# Patient Record
Sex: Female | Born: 1955
Health system: Southern US, Community
[De-identification: ages and names within clinical notes are randomized; demographics above are authoritative.]

## PROBLEM LIST (undated history)

## (undated) DIAGNOSIS — F3289 Other specified depressive episodes: Secondary | ICD-10-CM

## (undated) DIAGNOSIS — E538 Deficiency of other specified B group vitamins: Secondary | ICD-10-CM

## (undated) DIAGNOSIS — F329 Major depressive disorder, single episode, unspecified: Secondary | ICD-10-CM

## (undated) DIAGNOSIS — K219 Gastro-esophageal reflux disease without esophagitis: Secondary | ICD-10-CM

## (undated) DIAGNOSIS — K589 Irritable bowel syndrome without diarrhea: Secondary | ICD-10-CM

## (undated) DIAGNOSIS — E785 Hyperlipidemia, unspecified: Secondary | ICD-10-CM

## (undated) DIAGNOSIS — J309 Allergic rhinitis, unspecified: Secondary | ICD-10-CM

## (undated) HISTORY — DX: Deficiency of other specified B group vitamins: E53.8

## (undated) HISTORY — DX: Hyperlipidemia, unspecified: E78.5

## (undated) HISTORY — DX: Gastro-esophageal reflux disease without esophagitis: K21.9

## (undated) HISTORY — DX: Other specified depressive episodes: F32.89

## (undated) HISTORY — DX: Irritable bowel syndrome, unspecified: K58.9

## (undated) HISTORY — DX: Major depressive disorder, single episode, unspecified: F32.9

## (undated) HISTORY — DX: Allergic rhinitis, unspecified: J30.9

---

## 1999-08-04 ENCOUNTER — Other Ambulatory Visit: Admission: RE | Admit: 1999-08-04 | Discharge: 1999-08-04 | Payer: Self-pay | Admitting: *Deleted

## 1999-09-09 ENCOUNTER — Encounter: Payer: Self-pay | Admitting: *Deleted

## 1999-09-09 ENCOUNTER — Encounter: Admission: RE | Admit: 1999-09-09 | Discharge: 1999-09-09 | Payer: Self-pay | Admitting: *Deleted

## 2005-11-22 HISTORY — PX: METATARSAL OSTEOTOMY: SHX1641

## 2009-01-13 LAB — CONVERTED CEMR LAB: Pap Smear: NORMAL

## 2009-02-10 ENCOUNTER — Encounter: Payer: Self-pay | Admitting: Internal Medicine

## 2009-03-21 ENCOUNTER — Ambulatory Visit: Payer: Self-pay | Admitting: Internal Medicine

## 2009-03-21 DIAGNOSIS — Z87891 Personal history of nicotine dependence: Secondary | ICD-10-CM | POA: Insufficient documentation

## 2009-03-21 DIAGNOSIS — J309 Allergic rhinitis, unspecified: Secondary | ICD-10-CM | POA: Insufficient documentation

## 2009-09-10 ENCOUNTER — Ambulatory Visit: Payer: Self-pay | Admitting: Internal Medicine

## 2009-09-10 DIAGNOSIS — R5381 Other malaise: Secondary | ICD-10-CM | POA: Insufficient documentation

## 2009-09-10 DIAGNOSIS — R5383 Other fatigue: Secondary | ICD-10-CM

## 2009-09-11 DIAGNOSIS — E538 Deficiency of other specified B group vitamins: Secondary | ICD-10-CM | POA: Insufficient documentation

## 2009-09-11 DIAGNOSIS — F419 Anxiety disorder, unspecified: Secondary | ICD-10-CM

## 2009-09-11 DIAGNOSIS — F32A Depression, unspecified: Secondary | ICD-10-CM | POA: Insufficient documentation

## 2009-09-11 DIAGNOSIS — F329 Major depressive disorder, single episode, unspecified: Secondary | ICD-10-CM

## 2009-09-12 ENCOUNTER — Encounter: Payer: Self-pay | Admitting: Internal Medicine

## 2009-09-12 LAB — CONVERTED CEMR LAB
ALT: 21 units/L (ref 0–35)
AST: 22 units/L (ref 0–37)
Albumin: 4.2 g/dL (ref 3.5–5.2)
Alkaline Phosphatase: 50 units/L (ref 39–117)
BUN: 11 mg/dL (ref 6–23)
Basophils Absolute: 0 10*3/uL (ref 0.0–0.1)
Basophils Relative: 0.9 % (ref 0.0–3.0)
Bilirubin Urine: NEGATIVE
Bilirubin, Direct: 0.1 mg/dL (ref 0.0–0.3)
CO2: 28 meq/L (ref 19–32)
Calcium: 8.9 mg/dL (ref 8.4–10.5)
Chloride: 105 meq/L (ref 96–112)
Creatinine, Ser: 0.7 mg/dL (ref 0.4–1.2)
Eosinophils Absolute: 0.2 10*3/uL (ref 0.0–0.7)
Eosinophils Relative: 4.6 % (ref 0.0–5.0)
Folate: 8.3 ng/mL
GFR calc non Af Amer: 93.29 mL/min (ref >60)
Glucose, Bld: 91 mg/dL (ref 70–99)
HCT: 39.5 % (ref 36.0–46.0)
Hemoglobin: 13.8 g/dL (ref 12.0–15.0)
Ketones, ur: NEGATIVE mg/dL
Leukocytes, UA: NEGATIVE
Lymphocytes Relative: 24.8 % (ref 12.0–46.0)
Lymphs Abs: 1.3 10*3/uL (ref 0.7–4.0)
MCHC: 34.9 g/dL (ref 30.0–36.0)
MCV: 97.5 fL (ref 78.0–100.0)
Monocytes Absolute: 0.4 10*3/uL (ref 0.1–1.0)
Monocytes Relative: 7 % (ref 3.0–12.0)
Neutro Abs: 3.4 10*3/uL (ref 1.4–7.7)
Neutrophils Relative %: 62.7 % (ref 43.0–77.0)
Nitrite: NEGATIVE
Platelets: 316 10*3/uL (ref 150.0–400.0)
Potassium: 4.6 meq/L (ref 3.5–5.1)
RBC: 4.05 M/uL (ref 3.87–5.11)
RDW: 11.6 % (ref 11.5–14.6)
Sodium: 141 meq/L (ref 135–145)
Specific Gravity, Urine: 1.015 (ref 1.000–1.030)
TSH: 0.99 microintl units/mL (ref 0.35–5.50)
Total Bilirubin: 0.6 mg/dL (ref 0.3–1.2)
Total Protein, Urine: NEGATIVE mg/dL
Total Protein: 6.9 g/dL (ref 6.0–8.3)
Urine Glucose: NEGATIVE mg/dL
Urobilinogen, UA: 0.2 (ref 0.0–1.0)
Vitamin B-12: 194 pg/mL — ABNORMAL LOW (ref 211–911)
WBC: 5.3 10*3/uL (ref 4.5–10.5)
pH: 5.5 (ref 5.0–8.0)

## 2009-12-16 ENCOUNTER — Encounter (INDEPENDENT_AMBULATORY_CARE_PROVIDER_SITE_OTHER): Payer: Self-pay | Admitting: *Deleted

## 2010-01-02 ENCOUNTER — Ambulatory Visit: Payer: Self-pay | Admitting: Internal Medicine

## 2010-01-02 DIAGNOSIS — H6120 Impacted cerumen, unspecified ear: Secondary | ICD-10-CM | POA: Insufficient documentation

## 2010-01-19 ENCOUNTER — Encounter (INDEPENDENT_AMBULATORY_CARE_PROVIDER_SITE_OTHER): Payer: Self-pay

## 2010-01-20 ENCOUNTER — Ambulatory Visit: Payer: Self-pay | Admitting: Internal Medicine

## 2010-02-13 ENCOUNTER — Ambulatory Visit: Payer: Self-pay | Admitting: Internal Medicine

## 2010-06-10 ENCOUNTER — Ambulatory Visit: Payer: Self-pay | Admitting: Internal Medicine

## 2010-06-10 DIAGNOSIS — K219 Gastro-esophageal reflux disease without esophagitis: Secondary | ICD-10-CM | POA: Insufficient documentation

## 2010-06-12 ENCOUNTER — Ambulatory Visit: Payer: Self-pay | Admitting: Internal Medicine

## 2010-06-12 ENCOUNTER — Telehealth: Payer: Self-pay | Admitting: Internal Medicine

## 2010-11-30 ENCOUNTER — Encounter: Payer: Self-pay | Admitting: Internal Medicine

## 2010-11-30 ENCOUNTER — Ambulatory Visit
Admission: RE | Admit: 2010-11-30 | Discharge: 2010-11-30 | Payer: Self-pay | Source: Home / Self Care | Attending: Internal Medicine | Admitting: Internal Medicine

## 2010-11-30 DIAGNOSIS — R1084 Generalized abdominal pain: Secondary | ICD-10-CM | POA: Insufficient documentation

## 2010-12-02 LAB — CONVERTED CEMR LAB: Tissue Transglutaminase Ab, IgA: 8.4 U

## 2010-12-24 NOTE — Assessment & Plan Note (Signed)
Summary: ?acid reflux/#/cd   Vital Signs:  Patient profile:   55 year old female Menstrual status:  regular Height:      67 inches (170.18 cm) Weight:      167.0 pounds (75.91 kg) O2 Sat:      98 % on Room air Temp:     97.7 degrees F (36.50 degrees C) oral Pulse rate:   75 / minute BP sitting:   120 / 72  (left arm) Cuff size:   regular  Vitals Entered By: Orlan Leavens (June 10, 2010 11:31 AM)  O2 Flow:  Room air CC: ? acid reflux Is Patient Diabetic? No Pain Assessment Patient in pain? no        Primary Care Provider:  Newt Lukes MD  CC:  ? acid reflux.  History of Present Illness: c/o ?reflux - onset 3 months ago -  symptoms occur daily - manifest by "gunk" in back of throat upon waking each morning - occ burning in chest and "upset" churning in stomach - symptoms unchanged by food intake or type of food  Current Medications (verified): 1)  Nascobal 500 Mcg/0.75ml Soln (Cyanocobalamin) .... Use 1 Spray in One Nostril Once A Week 2)  Celexa 10 Mg Tabs (Citalopram Hydrobromide) .... On Hold  Allergies (verified): 1)  ! * Pollen 2)  ! * Mold 3)  ! * Cat Dander  Past History:  Past Medical History: Allergic rhinitis B12 defic GERD  Review of Systems  The patient denies fever, headaches, abdominal pain, and severe indigestion/heartburn.    Physical Exam  General:  alert, well-developed, well-nourished, and cooperative to examination.    Lungs:  normal respiratory effort, no intercostal retractions or use of accessory muscles; normal breath sounds bilaterally - no crackles and no wheezes.    Heart:  normal rate, regular rhythm, no murmur, and no rub. BLE without edema. Abdomen:  soft, non-tender, normal bowel sounds, no distention; no masses and no appreciable hepatomegaly or splenomegaly.   Psych:  Oriented X3, memory intact for recent and remote, normally interactive, good eye contact, not anxious appearing, not depressed appearing, and not  agitated.      Impression & Recommendations:  Problem # 1:  GERD (ICD-530.81)  Her updated medication list for this problem includes:    Omeprazole 20 Mg Cpdr (Omeprazole) .Marland Kitchen... 1 by mouth two times a day (or as instructed)  Labs Reviewed: Hgb: 13.8 (09/10/2009)   Hct: 39.5 (09/10/2009)  Problem # 2:  ALLERGIC RHINITIS (ICD-477.9) tx same may also help the AM awakening symptoms of "gunk" in throat (along with PPI tx) Her updated medication list for this problem includes:    Loratadine 10 Mg Tabs (Loratadine) .Marland Kitchen... 1 by mouth at bedtime  Discussed use of allergy medications and environmental measures.   Problem # 3:  VITAMIN B12 DEFICIENCY (ICD-266.2) pt wishes to change nasal spray to montly IM (for improved compliance) - will return Fri for nurse visit to teach family member on administration as pt hopes to do this at home rather than monthly OV here  Complete Medication List: 1)  Cyanocobalamin 1000 Mcg/ml Soln (Cyanocobalamin) .... Im monthly 2)  Omeprazole 20 Mg Cpdr (Omeprazole) .Marland Kitchen.. 1 by mouth two times a day (or as instructed) 3)  Loratadine 10 Mg Tabs (Loratadine) .Marland Kitchen.. 1 by mouth at bedtime  Patient Instructions: 1)  it was good to see you today. 2)  start allergy and reflux med tx as discussed -  your prescriptions have been given to  you yo submit to your pharmacy. Please take as directed. Contact our office if you believe you're having problems with the medication(s).  3)  if continued symptoms in 4 weeks, call for re-evaluation of treatment 4)  change B12 nose spray to monthly shots - return on Friday for nurse visit to do this - if your daughter is comfortable doing same for you each month at home, will give you prescription for medicine and supplies at that time.  Prescriptions: LORATADINE 10 MG TABS (LORATADINE) 1 by mouth at bedtime  #30 x 3   Entered and Authorized by:   Newt Lukes MD   Signed by:   Newt Lukes MD on 06/10/2010   Method used:    Print then Give to Patient   RxID:   [id number] OMEPRAZOLE 20 MG CPDR (OMEPRAZOLE) 1 by mouth two times a day (or as instructed)  #60 x 1   Entered and Authorized by:   Newt Lukes MD   Signed by:   Newt Lukes MD on 06/10/2010   Method used:   Print then Give to Patient   RxID:   [id number]

## 2010-12-24 NOTE — Letter (Signed)
Summary: [facility] Instructions  [facility]  [address] [place], [place] [address]   Phone: [phone]  Fax: [phone]       AMELA HANDLEY    February 19, 1968    MRN: [id number]       Procedure Day /Date: Friday 02/13/10     Arrival Time: 7:30 am     Procedure Time: 8:30 am    Location of Procedure:                    _x_  [facility] (4th Floor)        PREPARATION FOR COLONOSCOPY WITH MIRALAX  Starting 5 days prior to your procedure  02/08/10 do not eat nuts, seeds, popcorn, corn, beans, peas,  salads, or any raw vegetables.  Do not take any fiber supplements (e.g. Metamucil, Citrucel, and Benefiber). ____________________________________________________________________________________________________     THE DAY BEFORE YOUR PROCEDURE         DATE:02/12/10  ONG:EXBMWUXL  1   Drink clear liquids the entire day-NO SOLID FOOD  2   Do not drink anything colored red or purple.  Avoid juices with pulp.  No orange juice.  3   Drink at least 64 oz. (8 glasses) of fluid/clear liquids during the day to prevent dehydration and help the prep work efficiently.  CLEAR LIQUIDS INCLUDE: Water Jello Ice Popsicles Tea (sugar ok, no milk/cream) Powdered fruit flavored drinks Coffee (sugar ok, no milk/cream) Gatorade Juice: apple, white grape, white cranberry  Lemonade Clear bullion, consomm, broth Carbonated beverages (any kind) Strained chicken noodle soup Hard Candy  4   Mix the entire bottle of Miralax with 64 oz. of Gatorade/Powerade in the morning and put in the refrigerator to chill.  5   At 3:00 pm take 2 Dulcolax/Bisacodyl tablets.  6   At 4:30 pm take one Reglan/Metoclopramide tablet.  7  Starting at 5:00 pm drink one 8 oz glass of the Miralax mixture every 15-20 minutes until you have finished drinking the entire 64 oz.  You should finish drinking prep around 7:30 or 8:00 pm.  8   If you are nauseated, you may take the 2nd  Reglan/Metoclopramide tablet at 6:30 pm.        9    At 8:00 pm take 2 more DULCOLAX/Bisacodyl tablets.     THE DAY OF YOUR PROCEDURE      DATE:  02/13/10 DAY: Friday  You may drink clear liquids until  6:30 am (2 HOURS BEFORE PROCEDURE).   MEDICATION INSTRUCTIONS  Unless otherwise instructed, you should take regular prescription medications with a small sip of water as early as possible the morning of your procedure.          OTHER INSTRUCTIONS  You will need a responsible adult at least 54 years of age to accompany you and drive you home.   This person must remain in the waiting room during your procedure.  Wear loose fitting clothing that is easily removed.  Leave jewelry and other valuables at home.  However, you may wish to bring a book to read or an iPod/MP3 player to listen to music as you wait for your procedure to start.  Remove all body piercing jewelry and leave at home.  Total time from sign-in until discharge is approximately 2-3 hours.  You should go home directly after your procedure and rest.  You can resume normal activities the day after your procedure.  The day of your procedure you should not:  Drive   Make legal decisions   Operate machinery   Drink alcohol   Return to work  You will receive specific instructions about eating, activities and medications before you leave.   The above instructions have been reviewed and explained to me by   Ulis Rias RN  January 20, 2010 9:04 AM     I fully understand and can verbalize these instructions _____________________________ Date _______

## 2010-12-24 NOTE — Letter (Signed)
Summary: Previsit letter  [facility]  [address] [place], [place] [address]   Phone: [phone]  Fax: [phone]       12/16/2009 MRN: [id number]  [facility] [address] [place], [place]  [address]  Dear Ms. Dohrmann,  Welcome to the Gastroenterology Division at [facility].    You are scheduled to see a nurse for your pre-procedure visit on 01/20/2010 at 9:00AM on the 3rd/ floor at [facility], [address]  We ask that you try to arrive at our office 15 minutes prior to your appointment time to allow for check-in.  Your nurse visit will consist of discussing your medical and surgical history, your immediate family medical history, and your medications.    Please bring a complete list of all your medications or, if you prefer, bring the medication bottles and we will list them.  We will need to be aware of both prescribed and over the counter drugs.  We will need to know exact dosage information as well.  If you are on blood thinners (Coumadin, Plavix, Aggrenox, Ticlid, etc.) please call our office today/prior to your appointment, as we need to consult with your physician about holding your medication.   Please be prepared to read and sign documents such as consent forms, a financial agreement, and acknowledgement forms.  If necessary, and with your consent, a friend or relative is welcome to sit-in on the nurse visit with you.  Please bring your insurance card so that we may make a copy of it.  If your insurance requires a referral to see a specialist, please bring your referral form from your primary care physician.  No co-pay is required for this nurse visit.     If you cannot keep your appointment, please call [phone] to cancel or reschedule prior to your appointment date.  This allows [place] the opportunity to schedule an appointment for another patient in need of care.    Thank you for choosing [facility] for your medical needs.   We appreciate the opportunity to care for you.  Please visit [place] at our website  to learn more about our practice.                     Sincerely.                                                                                                                   The Gastroenterology Division

## 2010-12-24 NOTE — Miscellaneous (Signed)
Summary: Lec previsit  Clinical Lists Changes  Medications: Added new medication of MIRALAX   POWD (POLYETHYLENE GLYCOL 3350) As per prep  instructions. - Signed Added new medication of REGLAN 10 MG  TABS (METOCLOPRAMIDE HCL) As per prep instructions. - Signed Added new medication of DULCOLAX 5 MG  TBEC (BISACODYL) Day before procedure take 2 at 3pm and 2 at 8pm. - Signed Rx of MIRALAX   POWD (POLYETHYLENE GLYCOL 3350) As per prep  instructions.;  #255gm x 0;  Signed;  Entered by: Ulis Rias RN;  Authorized by: Hart Carwin MD;  Method used: Electronically to [facility] [address] [place], [place], [place]  [address], Ph: [phone], Fax: [phone] Rx of REGLAN 10 MG  TABS (METOCLOPRAMIDE HCL) As per prep instructions.;  #2 x 0;  Signed;  Entered by: Ulis Rias RN;  Authorized by: Hart Carwin MD;  Method used: Electronically to [facility] [address], [place], [place], [place]  [address], Ph: [phone], Fax: [phone] Rx of DULCOLAX 5 MG  TBEC (BISACODYL) Day before procedure take 2 at 3pm and 2 at 8pm.;  #4 x 0;  Signed;  Entered by: Ulis Rias RN;  Authorized by: Hart Carwin MD;  Method used: Electronically to [facility] [address], [place], [place], [place]  [address], Ph: [phone], Fax: [phone] Observations: Added new observation of ALLERGY REV: Done (01/20/2010 8:42)    Prescriptions: DULCOLAX 5 MG  TBEC (BISACODYL) Day before procedure take 2 at 3pm and 2 at 8pm.  #4 x 0   Entered by:   Ulis Rias RN   Authorized by:   Hart Carwin MD   Signed by:   Ulis Rias RN on 01/20/2010   Method used:   Electronically to        [facility] (retail)       [address]       [place], [place]  [address]       Ph: [phone]       Fax: [phone]   RxID:   [id number] REGLAN 10 MG  TABS (METOCLOPRAMIDE HCL) As per prep instructions.   #2 x 0   Entered by:   Ulis Rias RN   Authorized by:   Hart Carwin MD   Signed by:   Ulis Rias RN on 01/20/2010   Method used:   Electronically to        [facility] (retail)       [address]       [place], [place]  [address]       Ph: [phone]       Fax: [phone]   RxID:   [id number] MIRALAX   POWD (POLYETHYLENE GLYCOL 3350) As per prep  instructions.  #255gm x 0   Entered by:   Ulis Rias RN   Authorized by:   Hart Carwin MD   Signed by:   Ulis Rias RN on 01/20/2010   Method used:   Electronically to        [facility] (retail)       [address]       [place], [place]  [address]       Ph: [phone]       Fax: [phone]   RxID:   [id number]

## 2010-12-24 NOTE — Assessment & Plan Note (Signed)
Summary: f/u appt/cd   Vital Signs:  Patient profile:   55 year old female Menstrual status:  regular Height:      67 inches (170.18 cm) Weight:      165.8 pounds (75.36 kg) BMI:     26.06 O2 Sat:      98 % on Room air Temp:     98.1 degrees F (36.72 degrees C) oral Pulse rate:   82 / minute BP sitting:   112 / 80  (left arm) Cuff size:   regular  Vitals Entered By: Orlan Leavens RMA (November 30, 2010 8:36 AM)  O2 Flow:  Room air CC: follow-up visit Is Patient Diabetic? No Pain Assessment Patient in pain? no      Comments Want to discuss B12 injections   Primary Care Provider:  Newt Lukes MD  CC:  follow-up visit.  History of Present Illness: GERD onset spring 2011 symptoms occur daily - manifest by "gunk" in back of throat upon waking each morning - occ burning in chest and "upset" churning in stomach - symptoms unchanged by food intake or type of food a/w abd bloating and discomfort no change bowels or weight not improved with 2x/d omeprazole  B12 defic - dx 08/2009 when c/o fatigue on monthly B12 shots at home -  inconsistent admin due to difficulty obtaining supplies   Clinical Review Panels:  Prevention   Last Mammogram:  normal (11/22/2006)   Last Pap Smear:  normal (01/13/2009)   Last Colonoscopy:  DONE (02/13/2010)  CBC   WBC:  5.3 (09/10/2009)   RBC:  4.05 (09/10/2009)   Hgb:  13.8 (09/10/2009)   Hct:  39.5 (09/10/2009)   Platelets:  316.0 (09/10/2009)   MCV  97.5 (09/10/2009)   MCHC  34.9 (09/10/2009)   RDW  11.6 (09/10/2009)   PMN:  62.7 (09/10/2009)   Lymphs:  24.8 (09/10/2009)   Monos:  7.0 (09/10/2009)   Eosinophils:  4.6 (09/10/2009)   Basophil:  0.9 (09/10/2009)  Complete Metabolic Panel   Glucose:  91 (09/10/2009)   Sodium:  141 (09/10/2009)   Potassium:  4.6 (09/10/2009)   Chloride:  105 (09/10/2009)   CO2:  28 (09/10/2009)   BUN:  11 (09/10/2009)   Creatinine:  0.7 (09/10/2009)   Albumin:  4.2 (09/10/2009)   Total  Protein:  6.9 (09/10/2009)   Calcium:  8.9 (09/10/2009)   Total Bili:  0.6 (09/10/2009)   Alk Phos:  50 (09/10/2009)   SGPT (ALT):  21 (09/10/2009)   SGOT (AST):  22 (09/10/2009)   Current Medications (verified): 1)  Omeprazole 20 Mg Cpdr (Omeprazole) .Marland Kitchen.. 1 By Mouth Two Times A Day (Or As Instructed) 2)  Loratadine 10 Mg Tabs (Loratadine) .Marland Kitchen.. 1 By Mouth At Bedtime 3)  Cyanocobalamin 1000 Mcg/ml Soln (Cyanocobalamin) .... Take 1 Injection Q Month Im 4)  Bd Syringe/needle 25g X 5/8" 3 Ml Misc (Syringe/needle (Disp)) .... Use 1 Q Month To Give B12 Injection  Allergies (verified): 1)  ! * Pollen 2)  ! * Mold 3)  ! * Cat Dander  Past History:  Past Medical History: Allergic rhinitis B12 defic - dx 08/2009 GERD  MD roster: GI - brodie  Past Surgical History: Caesarean section x's 2  Broken foot-fifth metatarsal (2007)  Family History: Reviewed history from 03/21/2009 and no changes required. Family History of Colon CA 1st degree relative <60 (brother) Family History Lung cancer (father) - died age 45 mom died age 78 - decline after hip surg  Social History: Reviewed history from 03/21/2009 and no changes required. 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    Physical Exam  General:  alert, well-developed, well-nourished, and cooperative to examination.    Lungs:  normal respiratory effort, no intercostal retractions or use of accessory muscles; normal breath sounds bilaterally - no crackles and no wheezes.    Heart:  normal rate, regular rhythm, no murmur, and no rub. BLE without edema. Abdomen:  soft, non-tender, normal bowel sounds, no distention; no masses and no appreciable hepatomegaly or splenomegaly.     Impression & Recommendations:  Problem # 1:   GERD (ICD-530.81)  change omeprazole to different PPI - erx for protonix done also check for celiac given abd bloat and discomfort with B12 defic  Her updated medication list for this problem includes:    Protonix 40 Mg Tbec (Pantoprazole sodium) .Marland Kitchen... 1 by mouth two times a day (or as directed)  Orders: T-Celiac Disease Ab Evaluation (8756) Prescription Created Electronically [phone])  Labs Reviewed: Hgb: 13.8 (09/10/2009)   Hct: 39.5 (09/10/2009)  Problem # 2:  ABDOMINAL PAIN, GENERALIZED (ICD-789.07) exam and hx benign - check for  and rec gluten free trial diet as well as change in PPI  Orders: T-Celiac Disease Ab Evaluation (8002)  Problem # 3:  VITAMIN B12 DEFICIENCY (ICD-266.2)  Orders: T-Celiac Disease Ab Evaluation (8002) Vit B12 1000 mcg (J3420) Admin of Therapeutic Inj  intramuscular or subcutaneous (51884)  inconsistent nasal spray and IM replacement  monthly OV here discussed - shot today  Complete Medication List: 1)  Protonix 40 Mg Tbec (Pantoprazole sodium) .Marland Kitchen.. 1 by mouth two times a day (or as directed) 2)  Loratadine 10 Mg Tabs (Loratadine) .Marland Kitchen.. 1 by mouth at bedtime 3)  Cyanocobalamin 1000 Mcg/ml Soln (Cyanocobalamin) .... Take 1 injection q month im 4)  Bd Syringe/needle 25g X 5/8" 3 Ml Misc (Syringe/needle (disp)) .... Use 1 q month to give b12 injection  Patient Instructions: 1)  it was good to see you today. 2)  change omeprazole to generic Protonic for reflux symptoms - your prescriptions have been electronically submitted to your pharmacy. Please take as directed. Contact our office if you believe you're having problems with the medication(s). 3)  nurse visit here for B12 shot each month, or call if refills on nasal spray are needed 4)  test(s) ordered today - your results will be called to you after review in 48-72 hours from the time of test completion 5)  in meanwhile, try gluten free diet to help with bloating and abdominal discomfort 6)  Please  schedule a follow-up appointment in 3-6 months to review reflux and GI symptoms, call sooner if problems.  Prescriptions: PROTONIX 40 MG TBEC (PANTOPRAZOLE SODIUM) 1 by mouth two times a day (or as directed)  #60 x 3   Entered and Authorized by:   Newt Lukes MD   Signed by:   Newt Lukes MD on 11/30/2010   Method used:   Electronically to        [facility] (retail)       [address]       [place], [place]  [address]       Ph: [phone]  Fax: [phone]   RxID:   [id number]    Medication Administration  Injection # 1:    Medication: Vit B12 1000 mcg    Diagnosis: VITAMIN B12 DEFICIENCY (ICD-266.2)    Route: IM    Site: L deltoid    Exp Date: 06/2012    Lot #: [id number]    Mfr: [employer] Regent    Patient tolerated injection without complications    Given by: Orlan Leavens RMA (November 30, 2010 9:29 AM)  Orders Added: 1)  T-Celiac Disease Ab Evaluation [8002] 2)  Est. Patient Level IV [30865] 3)  Prescription Created Electronically [G8553] 4)  Vit B12 1000 mcg [J3420] 5)  Admin of Therapeutic Inj  intramuscular or subcutaneous [78469]

## 2010-12-24 NOTE — Assessment & Plan Note (Signed)
Summary: left ear wax/cd   Vital Signs:  Patient profile:   55 year old female Menstrual status:  regular Height:      67 inches (170.18 cm) Weight:      167.8 pounds (76.27 kg) BMI:     26.38 O2 Sat:      99 % on Room air Temp:     97.2 degrees F (36.22 degrees C) oral Pulse rate:   82 / minute BP sitting:   130 / 82  (left arm) Cuff size:   regular  Vitals Entered By: Orlan Leavens (January 02, 2010 8:32 AM)  O2 Flow:  Room air CC: ? (L) ear clogged Is Patient Diabetic? No Pain Assessment Patient in pain? no        Primary Care Provider:  Newt Lukes MD  CC:  ? (L) ear clogged.  History of Present Illness: here with decrease in hearing left side - tried using Qtip and felt more clogged - feels wax buildup likely... has tried using OTC drops w/o chnage in hearing or clogged feeling remote hx same requiring irrigation - no pain in ear - no HA no drainage from ear  B12 defic - reports compliance with ongoing medical treatment and no changes in medication dose or frequency. denies adverse side effects related to current therapy.   depression - not taking celexa but did have it feeled - hoping B12 replacement will resolve symptoms - does feel better and is more active than in the fall no sadness, sleeping well no anorexia or weight changes-  Current Medications (verified): 1)  Nascobal 500 Mcg/0.15ml Soln (Cyanocobalamin) .... Use 1 Spray in One Nostril Once A Week 2)  Celexa 10 Mg Tabs (Citalopram Hydrobromide) .... Take 1 Po Qd  Allergies (verified): 1)  ! * Pollen 2)  ! * Mold 3)  ! * Cat Dander  Past History:  Past Medical History: Allergic rhinitis B12 defic  Review of Systems       The patient complains of decreased hearing.  The patient denies fever, vision loss, chest pain, syncope, and headaches.    Physical Exam  General:  alert, well-developed, well-nourished, and cooperative to examination.    Eyes:  vision grossly intact; pupils  equal, round and reactive to light.  conjunctiva and lids normal.    Ears:  normal pinnae bilaterally, without erythema,  swelling, or tenderness to palpation - L ear with obstructing soft yellow cerumen - after irrigation, TMs clear without effusion, or cerumen impaction. Hearing grossly normal bilaterally  Mouth:  teeth and gums in good repair; mucous membranes moist, without lesions or ulcers. oropharynx clear without exudate, no erythema.  Lungs:  normal respiratory effort, no intercostal retractions or use of accessory muscles; normal breath sounds bilaterally - no crackles and no wheezes.    Heart:  normal rate, regular rhythm, no murmur, and no rub. BLE without edema. Psych:  Oriented X3, memory intact for recent and remote, normally interactive, good eye contact, not anxious appearing, not depressed appearing, and not agitated.    Additional Exam:  Procedure: ear irrigation Reason: wax impaction Risk/Benefit were discussed with patient who agrees to proceed. ear was irrigated with warm water. Large amount of wax was removed. Instrumentation with metal ear loop was performed to accomplish the wax removal. Patient tolerated procedure well. Complications: none    Impression & Recommendations:  Problem # 1:  CERUMEN IMPACTION, LEFT (ICD-380.4)  Orders: Cerumen Impaction Removal ([address])  Problem # 2:  VITAMIN B12 DEFICIENCY (  ICD-266.2) cont nasal spray - fatigue symptoms improved  Problem # 3:  DEPRESSION (ICD-311) not taking SSRI - but clinically seems well - ok to hold off on med and follow symptoms - pt to contact us if need for re-eval of same Her updated medication list for this problem includes:    Celexa 10 Mg Tabs (Citalopram hydrobromide) ..... On hold  Complete Medication List: 1)  Nascobal 500 Mcg/0.96ml Soln (Cyanocobalamin) .... Use 1 spray in one nostril once a week 2)  Celexa 10 Mg Tabs (Citalopram hydrobromide) .... On hold  Patient Instructions: 1)  it was good  to see you today.  2)  left ear irrigated and cleared today - 3)  continue medications as reviewed - let us know if "depression symptoms" are starting back or changing 4)  Please keep follow-up appointment as previously discussed (appprox 6 months), sooner if problems.

## 2010-12-24 NOTE — Assessment & Plan Note (Signed)
Summary: B12/JSS  Nurse Visit   Vitals Entered By: Orlan Leavens (June 12, 2010 11:48 AM) CC: B12 injection   Allergies: 1)  ! * Pollen 2)  ! * Mold 3)  ! * Cat Dander  Medication Administration  Injection # 1:    Medication: Vit B12 1000 mcg    Diagnosis: VITAMIN B12 DEFICIENCY (ICD-266.2)    Route: IM    Site: R deltoid    Exp Date: 02/2012    Lot #: [id number]    Mfr: American Regent    Patient tolerated injection without complications    Given by: Orlan Leavens (June 12, 2010 11:49 AM)  Orders Added: 1)  Vit B12 1000 mcg [J3420] 2)  Admin of Therapeutic Inj  intramuscular or subcutaneous [[address]]

## 2010-12-24 NOTE — Progress Notes (Signed)
Summary: b12 admin and rx med+supplies  Phone Note Call from Patient   Caller: Patient Summary of Call: Pt came in for B12 injection, brought daughter to watch admin so that she can give B12 to her at home. pt states she feel comfortable with daughter giving injection and req rx for B12 to be sent to [facility] on [address] Initial call taken by: Orlan Leavens,  June 12, 2010 11:52 AM  Follow-up for Phone Call        agree - thanks! Follow-up by: Newt Lukes MD,  June 12, 2010 12:22 PM    New/Updated Medications: CYANOCOBALAMIN 1000 MCG/ML SOLN (CYANOCOBALAMIN) take 1 injection q month IM BD SYRINGE/NEEDLE 25G X 5/8" 3 ML MISC (SYRINGE/NEEDLE (DISP)) use 1 q month to give B12 injection Prescriptions: BD SYRINGE/NEEDLE 25G X 5/8" 3 ML MISC (SYRINGE/NEEDLE (DISP)) use 1 q month to give B12 injection  #12 x 0   Entered by:   Orlan Leavens   Authorized by:   Newt Lukes MD   Signed by:   Orlan Leavens on 06/12/2010   Method used:   Electronically to        [facility] (retail)       [address]       [place], [place]  [address]       Ph: [phone]       Fax: [phone]   RxID:   [id number] CYANOCOBALAMIN 1000 MCG/ML SOLN (CYANOCOBALAMIN) take 1 injection q month IM  #13ml x 6   Entered by:   Orlan Leavens   Authorized by:   Newt Lukes MD   Signed by:   Orlan Leavens on 06/12/2010   Method used:   Electronically to        [facility] (retail)       [address]       [place], [place]  [address]       Ph: [phone]       Fax: [phone]   RxID:   [id number]   Appended Document: b12 admin and rx med+supplies [facility] called to inform MD that B-12 inj are on long term back order. Per pharmacy they will inform pt of the same.

## 2010-12-24 NOTE — Procedures (Signed)
Summary: Colonoscopy  Patient: Teresa Monroe Note: All result statuses are Final unless otherwise noted.  Tests: (1) Colonoscopy (COL)   COL Colonoscopy           DONE     [facility]     [address]     [place], [place]  [address]           COLONOSCOPY PROCEDURE REPORT           PATIENT:  Teresa, Monroe  MR#:  [id number]     BIRTHDATE:  Nov 14, 1956, 53 yrs. old  GENDER:  female     ENDOSCOPIST:  Hedwig Morton. Juanda Chance, MD     REF. BY:  Rene Paci, M.D.     PROCEDURE DATE:  02/13/2010     PROCEDURE:  Colonoscopy 295[address]     ASA CLASS:  Class I     INDICATIONS:  Routine Risk Screening     MEDICATIONS:   Versed 10 mg, Fentanyl 100 mcg           DESCRIPTION OF PROCEDURE:   After the risks benefits and     alternatives of the procedure were thoroughly explained, informed     consent was obtained.  Digital rectal exam was performed and     revealed no rectal masses.   The LB [id number] endoscope     was introduced through the anus and advanced to the cecum, which     was identified by both the appendix and ileocecal valve, without     limitations.  The quality of the prep was good, using MiraLax.     The instrument was then slowly withdrawn as the colon was fully     examined.     <<PROCEDUREIMAGES>>           FINDINGS:  No polyps or cancers were seen (see image1, image2,     image3, image4, and image5).   Retroflexed views in the rectum     revealed no abnormalities.    The scope was then withdrawn from     the patient and the procedure completed.           COMPLICATIONS:  None     ENDOSCOPIC IMPRESSION:     1) No polyps or cancers     2) Normal colonoscopy     RECOMMENDATIONS:     1) high fiber diet     REPEAT EXAM:  In 10 year(s) for.           ______________________________     Hedwig Morton. Juanda Chance, MD           CC:           n.     eSIGNED:   Hedwig Morton. Tezra Mahr at 02/13/2010 09:22 AM           Cathlean Sauer, [id number]  Note: An exclamation mark  (!) indicates a result that was not dispersed into the flowsheet. Document Creation Date: 02/13/2010 9:23 AM _______________________________________________________________________  (1) Order result status: Final Collection or observation date-time: 02/13/2010 09:08 Requested date-time:  Receipt date-time:  Reported date-time:  Referring Physician:   Ordering Physician: Lina Sar [phone]) Specimen Source:  Source: Launa Grill Order Number: [id number] Lab site:   Appended Document: Colonoscopy    Clinical Lists Changes  Observations: Added new observation of COLONNXTDUE: 01/2020 (02/13/2010 11:21)

## 2011-01-04 ENCOUNTER — Other Ambulatory Visit: Payer: Commercial Managed Care - PPO

## 2011-01-04 ENCOUNTER — Encounter: Payer: Self-pay | Admitting: Internal Medicine

## 2011-01-04 ENCOUNTER — Ambulatory Visit (INDEPENDENT_AMBULATORY_CARE_PROVIDER_SITE_OTHER): Payer: Commercial Managed Care - PPO | Admitting: Internal Medicine

## 2011-01-04 ENCOUNTER — Other Ambulatory Visit: Payer: Self-pay | Admitting: Internal Medicine

## 2011-01-04 DIAGNOSIS — R1084 Generalized abdominal pain: Secondary | ICD-10-CM

## 2011-01-04 DIAGNOSIS — R5383 Other fatigue: Secondary | ICD-10-CM

## 2011-01-04 DIAGNOSIS — K219 Gastro-esophageal reflux disease without esophagitis: Secondary | ICD-10-CM

## 2011-01-04 DIAGNOSIS — F329 Major depressive disorder, single episode, unspecified: Secondary | ICD-10-CM

## 2011-01-04 DIAGNOSIS — R5381 Other malaise: Secondary | ICD-10-CM

## 2011-01-04 DIAGNOSIS — E538 Deficiency of other specified B group vitamins: Secondary | ICD-10-CM

## 2011-01-04 LAB — CBC WITH DIFFERENTIAL/PLATELET
Basophils Absolute: 0 10*3/uL (ref 0.0–0.1)
Basophils Relative: 0.5 % (ref 0.0–3.0)
Eosinophils Absolute: 0.1 10*3/uL (ref 0.0–0.7)
Eosinophils Relative: 1.8 % (ref 0.0–5.0)
HCT: 36.7 % (ref 36.0–46.0)
Hemoglobin: 12.6 g/dL (ref 12.0–15.0)
Lymphocytes Relative: 31 % (ref 12.0–46.0)
Lymphs Abs: 2 10*3/uL (ref 0.7–4.0)
MCHC: 34.4 g/dL (ref 30.0–36.0)
MCV: 95.4 fl (ref 78.0–100.0)
Monocytes Absolute: 0.6 10*3/uL (ref 0.1–1.0)
Monocytes Relative: 9.4 % (ref 3.0–12.0)
Neutro Abs: 3.7 10*3/uL (ref 1.4–7.7)
Neutrophils Relative %: 57.3 % (ref 43.0–77.0)
Platelets: 305 10*3/uL (ref 150.0–400.0)
RBC: 3.85 Mil/uL — ABNORMAL LOW (ref 3.87–5.11)
RDW: 12.8 % (ref 11.5–14.6)
WBC: 6.4 10*3/uL (ref 4.5–10.5)

## 2011-01-04 LAB — BASIC METABOLIC PANEL
BUN: 13 mg/dL (ref 6–23)
CO2: 27 mEq/L (ref 19–32)
Calcium: 8.7 mg/dL (ref 8.4–10.5)
Chloride: 100 mEq/L (ref 96–112)
Creatinine, Ser: 1 mg/dL (ref 0.4–1.2)
GFR: 60.11 mL/min (ref >60.00)
Glucose, Bld: 82 mg/dL (ref 70–99)
Potassium: 4.4 mEq/L (ref 3.5–5.1)
Sodium: 139 mEq/L (ref 135–145)

## 2011-01-04 LAB — HEPATIC FUNCTION PANEL
ALT: 18 U/L (ref 0–35)
AST: 24 U/L (ref 0–37)
Albumin: 3.7 g/dL (ref 3.5–5.2)
Alkaline Phosphatase: 49 U/L (ref 39–117)
Bilirubin, Direct: 0.1 mg/dL (ref 0.0–0.3)
Total Bilirubin: 0.6 mg/dL (ref 0.3–1.2)
Total Protein: 6.6 g/dL (ref 6.0–8.3)

## 2011-01-04 LAB — TSH: TSH: 0.81 u[IU]/mL (ref 0.35–5.50)

## 2011-01-13 NOTE — Assessment & Plan Note (Signed)
Summary: follow-up   Vital Signs:  Patient profile:   55 year old female Menstrual status:  regular Height:      67 inches (170.18 cm) Weight:      165 pounds (75 kg) O2 Sat:      97 % on Room air Temp:     98.6 degrees F (37.00 degrees C) oral Pulse rate:   74 / minute BP sitting:   128 / 82  (left arm) Cuff size:   regular  Vitals Entered By: Orlan Leavens RMA (January 04, 2011 2:10 PM)  O2 Flow:  Room air CC: follow-up visit Is Patient Diabetic? No Pain Assessment Patient in pain? no        Primary Care Provider:  Newt Lukes MD  CC:  follow-up visit.  History of Present Illness: here for f/u continued abd bloat and discomfort - not improved with PPI trial 11/2010 for ?GERD- no weight changes, no bowel changes assoc with fatigue and anxiety symptoms  symptoms worse with stress ?IBS  B12 defic - dx 08/2009 when c/o fatigue on monthly B12 shots at home -  inconsistent admin due to difficulty obtaining supplies - now getting at OV   Clinical Review Panels:  Prevention   Last Mammogram:  normal (11/22/2006)   Last Pap Smear:  normal (01/13/2009)   Last Colonoscopy:  DONE (02/13/2010)  Immunizations   Last Tetanus Booster:  Td (11/22/2002)  CBC   WBC:  5.3 (09/10/2009)   RBC:  4.05 (09/10/2009)   Hgb:  13.8 (09/10/2009)   Hct:  39.5 (09/10/2009)   Platelets:  316.0 (09/10/2009)   MCV  97.5 (09/10/2009)   MCHC  34.9 (09/10/2009)   RDW  11.6 (09/10/2009)   PMN:  62.7 (09/10/2009)   Lymphs:  24.8 (09/10/2009)   Monos:  7.0 (09/10/2009)   Eosinophils:  4.6 (09/10/2009)   Basophil:  0.9 (09/10/2009)  Complete Metabolic Panel   Glucose:  91 (09/10/2009)   Sodium:  141 (09/10/2009)   Potassium:  4.6 (09/10/2009)   Chloride:  105 (09/10/2009)   CO2:  28 (09/10/2009)   BUN:  11 (09/10/2009)   Creatinine:  0.7 (09/10/2009)   Albumin:  4.2 (09/10/2009)   Total Protein:  6.9 (09/10/2009)   Calcium:  8.9 (09/10/2009)   Total Bili:  0.6  (09/10/2009)   Alk Phos:  50 (09/10/2009)   SGPT (ALT):  21 (09/10/2009)   SGOT (AST):  22 (09/10/2009)   Current Medications (verified): 1)  Loratadine 10 Mg Tabs (Loratadine) .Marland Kitchen.. 1 By Mouth At Bedtime 2)  Cyanocobalamin 1000 Mcg/ml Soln (Cyanocobalamin) .... Take 1 Injection Q Month Im 3)  Bd Syringe/needle 25g X 5/8" 3 Ml Misc (Syringe/needle (Disp)) .... Use 1 Q Month To Give B12 Injection  Allergies (verified): 1)  ! * Pollen 2)  ! * Mold 3)  ! * Cat Dander  Past History:  Past Medical History: Allergic rhinitis B12 defic - dx 08/2009 GERD   MD roster: GI - brodie  Review of Systems  The patient denies fever, weight loss, chest pain, syncope, headaches, melena, and hematochezia.    Physical Exam  General:  alert, well-developed, well-nourished, and cooperative to examination.    Lungs:  normal respiratory effort, no intercostal retractions or use of accessory muscles; normal breath sounds bilaterally - no crackles and no wheezes.    Heart:  normal rate, regular rhythm, no murmur, and no rub. BLE without edema. Abdomen:  soft, non-tender, normal bowel sounds, no distention; no masses and  no appreciable hepatomegaly or splenomegaly.     Impression & Recommendations:  Problem # 1:  ABDOMINAL PAIN, GENERALIZED (ICD-789.07)  Orders: TLB-BMP (Basic Metabolic Panel-BMET) (80048-METABOL) TLB-CBC Platelet - w/Differential (85025-CBCD) TLB-Hepatic/Liver Function Pnl (80076-HEPATIC) TLB-TSH (Thyroid Stimulating Hormone) (84443-TSH)  exam and hx benign - suspect IBS prev labs and eval for celiac dz negative - not improved with PPI or gluten free diet  will start SSRI - low dose sertraline for same if labs neg - pt understands and agrees  Problem # 2:  DEPRESSION (ICD-311)  Orders: TLB-BMP (Basic Metabolic Panel-BMET) (80048-METABOL) TLB-CBC Platelet - w/Differential (85025-CBCD) TLB-Hepatic/Liver Function Pnl (80076-HEPATIC) TLB-TSH (Thyroid Stimulating Hormone)  (84443-TSH)  prev on celexa but self dc'd same ?SSRI for IBS assoc with same - see above  Her updated medication list for this problem includes:    Sertraline Hcl 25 Mg Tabs (Sertraline hcl) .Marland Kitchen... 1 by mouth once daily  Problem # 3:  GERD (ICD-530.81)  The following medications were removed from the medication list:    Protonix 40 Mg Tbec (Pantoprazole sodium) .Marland Kitchen... 1 by mouth two times a day (or as directed)  Orders: TLB-BMP (Basic Metabolic Panel-BMET) (80048-METABOL) TLB-CBC Platelet - w/Differential (85025-CBCD) TLB-Hepatic/Liver Function Pnl (80076-HEPATIC) TLB-TSH (Thyroid Stimulating Hormone) (84443-TSH)  not improved with change omeprazole to protonix 11/2010 neg Ab for celiac despite abd bloat and discomfort with B12 defic  Labs Reviewed: Hgb: 13.8 (09/10/2009)   Hct: 39.5 (09/10/2009)  Problem # 4:  FATIGUE (ICD-780.79)  Orders: TLB-BMP (Basic Metabolic Panel-BMET) (80048-METABOL) TLB-CBC Platelet - w/Differential (85025-CBCD) TLB-Hepatic/Liver Function Pnl (80076-HEPATIC) TLB-TSH (Thyroid Stimulating Hormone) (84443-TSH)  no specific abn on hx or exam - check screening labs for underlying med abn - if normal labs, will begin trial of SSRI for treatment of poss depression/IBS and f/u 4-6 weeks  Complete Medication List: 1)  Loratadine 10 Mg Tabs (Loratadine) .Marland Kitchen.. 1 by mouth at bedtime 2)  Cyanocobalamin 1000 Mcg/ml Soln (Cyanocobalamin) .... Take 1 injection q month im 3)  Bd Syringe/needle 25g X 5/8" 3 Ml Misc (Syringe/needle (disp)) .... Use 1 q month to give b12 injection 4)  Sertraline Hcl 25 Mg Tabs (Sertraline hcl) .Marland Kitchen.. 1 by mouth once daily  Other Orders: Vit B12 1000 mcg (J3420) Admin of Therapeutic Inj  intramuscular or subcutaneous ([address])  Patient Instructions: 1)  it was good to see you today. 2)  continue omeprazole max 40mg /day for reflux symptoms -  3)  test(s) ordered today - your results will becalled to you after review in 48-72 hours from  the time of test completion; if normal, will start low dose sertraline for irritable bowel and anxiety symptoms  4)  nurse visit here for B12 shot each month, or call if refills on nasal spray are needed 5)  Please schedule a follow-up appointment in 4-6 weeks to review reflux and GI symptoms, call sooner if problems.  Prescriptions: BD SYRINGE/NEEDLE 25G X 5/8" 3 ML MISC (SYRINGE/NEEDLE (DISP)) use 1 q month to give B12 injection  #12 x 0   Entered by:   Orlan Leavens RMA   Authorized by:   Newt Lukes MD   Signed by:   Orlan Leavens RMA on 01/04/2011   Method used:   Electronically to        [facility] [phone][facility] (retail)       [address]       [place], [place]  [address]  Ph: [phone]       Fax: [phone]   RxID:   [id number]    Medication Administration  Injection # 1:    Medication: Vit B12 1000 mcg    Diagnosis: VITAMIN B12 DEFICIENCY (ICD-266.2)    Route: IM    Site: L deltoid    Exp Date: 09/2012    Lot #: [id number]    Mfr: American Regent    Patient tolerated injection without complications    Given by: Orlan Leavens RMA (January 04, 2011 3:03 PM)  Orders Added: 1)  Vit B12 1000 mcg [J3420] 2)  Admin of Therapeutic Inj  intramuscular or subcutaneous [[address]] 3)  TLB-BMP (Basic Metabolic Panel-BMET) [80048-METABOL] 4)  TLB-CBC Platelet - w/Differential [85025-CBCD] 5)  TLB-Hepatic/Liver Function Pnl [80076-HEPATIC] 6)  TLB-TSH (Thyroid Stimulating Hormone) [84443-TSH] 7)  Est. Patient Level IV [62952]

## 2011-01-14 ENCOUNTER — Telehealth: Payer: Self-pay | Admitting: Internal Medicine

## 2011-01-19 NOTE — Progress Notes (Signed)
Summary: Rx refill req  Phone Note Refill Request Message from:  Patient on January 14, 2011 11:09 AM  Refills Requested: Medication #1:  CYANOCOBALAMIN 1000 MCG/ML SOLN take 1 injection q month IM   Dosage confirmed as above?Dosage Confirmed   Supply Requested: 6 months  Method Requested: Electronic Initial call taken by: Margaret Pyle, CMA,  January 14, 2011 11:09 AM    Prescriptions: CYANOCOBALAMIN 1000 MCG/ML SOLN (CYANOCOBALAMIN) take 1 injection q month IM  #37ml x 6   Entered by:   Margaret Pyle, CMA   Authorized by:   Newt Lukes MD   Signed by:   Margaret Pyle, CMA on 01/14/2011   Method used:   Electronically to        [facility] [phone][facility] (retail)       [address]       [place], [place]  [address]       Ph: [phone]       Fax: [phone]   RxID:   [id number]

## 2011-03-05 ENCOUNTER — Encounter: Payer: Self-pay | Admitting: Internal Medicine

## 2011-03-08 ENCOUNTER — Encounter: Payer: Self-pay | Admitting: Internal Medicine

## 2011-03-08 ENCOUNTER — Ambulatory Visit (INDEPENDENT_AMBULATORY_CARE_PROVIDER_SITE_OTHER): Payer: Commercial Managed Care - PPO | Admitting: Internal Medicine

## 2011-03-08 DIAGNOSIS — B351 Tinea unguium: Secondary | ICD-10-CM

## 2011-03-08 DIAGNOSIS — K589 Irritable bowel syndrome without diarrhea: Secondary | ICD-10-CM

## 2011-03-08 MED ORDER — CICLOPIROX 8 % EX SOLN: Freq: Every day | CUTANEOUS | Status: AC

## 2011-03-08 MED ORDER — SERTRALINE HCL 50 MG PO TABS: 50.0000 mg | ORAL_TABLET | Freq: Every day | ORAL | Status: DC

## 2011-03-08 NOTE — Patient Instructions (Signed)
It was good to see you today. Will increase dose sertraline today and use nail polish on toes as discussed Your prescription(s) have been submitted to your pharmacy. Please take as directed and contact our office if you believe you are having problem(s) with the medication(s). Please schedule followup in 3-6 months for followup, call sooner if problems.

## 2011-03-08 NOTE — Assessment & Plan Note (Signed)
abd pain and bloat much improved on SSRI - will titrate up dose now - erx done prior eval for celiac AB neg but pt remains on gluten free diet

## 2011-03-08 NOTE — Progress Notes (Signed)
  Subjective:    Patient ID: Teresa Monroe, female    DOB: [birth year], 55 y.o.   MRN: [id number]  HPI   here for f/u IBS - associated with abd bloat and discomfort - not improved with PPI trial 11/2010 for ?GERD- no weight changes, no bowel changes assoc with fatigue and anxiety symptoms  symptoms worse with stress Labs 12/2010 WNL - started low dose SSRI - now much improved, but not at baseline yet  B12 defic - dx 08/2009 when c/o fatigue on monthly B12 shots at home -  inconsistent admin due to difficulty obtaining supplies - now getting at OV  Past Medical History  Diagnosis Date  . VITAMIN B12 DEFICIENCY 09/11/2009  . TOBACCO ABUSE 03/21/2009  . Abdominal pain, generalized   . DEPRESSION   . GERD   . IBS (irritable bowel syndrome)   . ALLERGIC RHINITIS     Review of Systems  Constitutional: Negative for fever and unexpected weight change.  Respiratory: Negative for shortness of breath.   Cardiovascular: Negative for chest pain.  Gastrointestinal: Negative for nausea, abdominal pain and diarrhea.  Musculoskeletal: Negative for back pain.  Skin: Negative for rash and wound.       [complains of nail fungus on B great toenails      Objective:   Physical Exam BP 100/72  Pulse 83  Temp(Src) 98.6 F (37 C) (Oral)  Ht 5\' 7"  (1.702 m)  Wt 166 lb 3.2 oz (75.388 kg)  BMI 26.03 kg/m2  SpO2 97% Physical Exam  Constitutional: She is oriented to person, place, and time. She appears well-developed and well-nourished. No distress.  Eyes: Conjunctivae and EOM are normal. Pupils are equal, round, and reactive to light. No scleral icterus.  Neck: Normal range of motion. Neck supple. No JVD present. No thyromegaly present.  Cardiovascular: Normal rate, regular rhythm and normal heart sounds.  No murmur heard. Pulmonary/Chest: Effort normal and breath sounds normal. No respiratory distress. She has no wheezes.  Abdominal: Soft. Bowel sounds are normal. She exhibits no distension.  There is no tenderness.  Psychiatric: She has a normal mood and affect. Her behavior is normal. Judgment and thought content normal.   Lab Results  Component Value Date   WBC 6.4 01/04/2011   HGB 12.6 01/04/2011   HCT 36.7 01/04/2011   PLT 305.0 01/04/2011   ALT 18 01/04/2011   AST 24 01/04/2011   NA 139 01/04/2011   K 4.4 01/04/2011   CL 100 01/04/2011   CREATININE 1.0 01/04/2011   BUN 13 01/04/2011   CO2 27 01/04/2011   TSH 0.81 01/04/2011        Assessment & Plan:  See problem list. Medications and labs reviewed today. IBS - improved on ssri - titrate up, cont same onchomychoisis b great nails - penlac rx today and cont tea tree oil - if anil removal desired, to call for referral to podiatry

## 2011-04-12 ENCOUNTER — Telehealth: Payer: Self-pay

## 2011-04-12 NOTE — Telephone Encounter (Signed)
Pt called stating that since Zoloft has been increase to 50mg  she has been experiencing loose bowels. Pt would like to know if this SE will improve with time or is she should reduce dose back to 25mg , please advise.

## 2011-04-13 NOTE — Telephone Encounter (Signed)
Pt advised.

## 2011-04-13 NOTE — Telephone Encounter (Signed)
Expect symptoms should improve with time, but if severe or ongoing >10-14days, reduce dose back to 25mg  and/or make OV - thanks

## 2011-04-21 ENCOUNTER — Encounter: Payer: Self-pay | Admitting: Internal Medicine

## 2011-07-14 ENCOUNTER — Ambulatory Visit (INDEPENDENT_AMBULATORY_CARE_PROVIDER_SITE_OTHER): Payer: Commercial Managed Care - PPO | Admitting: Internal Medicine

## 2011-07-14 ENCOUNTER — Ambulatory Visit (INDEPENDENT_AMBULATORY_CARE_PROVIDER_SITE_OTHER)
Admission: RE | Admit: 2011-07-14 | Discharge: 2011-07-14 | Disposition: A | Discharge status: Home / Self Care | Payer: Commercial Managed Care - PPO | Source: Ambulatory Visit | Attending: Internal Medicine | Admitting: Internal Medicine

## 2011-07-14 ENCOUNTER — Encounter: Payer: Self-pay | Admitting: Internal Medicine

## 2011-07-14 VITALS — BP 102/68 | HR 78 | Temp 97.6°F | Ht 67.0 in

## 2011-07-14 DIAGNOSIS — M79609 Pain in unspecified limb: Secondary | ICD-10-CM

## 2011-07-14 DIAGNOSIS — M79672 Pain in left foot: Secondary | ICD-10-CM

## 2011-07-14 NOTE — Progress Notes (Signed)
  Subjective:    Patient ID: Teresa Monroe, female    DOB: 22-Sep-1956, 55 y.o.   MRN: [id number]  HPI  complains of bump on top of L foot Onset 5 days ago Denies precipitating trauma or overuse Not associated with pain, redness or recent injury Located top of left foot between 2/3 MT/T junction No history of same - no other skin changes or joint swelling/pain Does not affect walking/standing activity  Past Medical History  Diagnosis Date  . VITAMIN B12 DEFICIENCY 09/11/2009  . DEPRESSION   . GERD   . IBS (irritable bowel syndrome)   . ALLERGIC RHINITIS     Review of Systems  Constitutional: Negative for fever.  Musculoskeletal: Negative for back pain and gait problem.  Neurological: Negative for syncope and headaches.       Objective:   Physical Exam  BP 102/68  Pulse 78  Temp(Src) 97.6 F (36.4 C) (Oral)  Ht 5\' 7"  (1.702 m)  SpO2 99% Gen: NAD MSkel: L foot with cyst over 2nd/3rd metatarsal-tarsal junction: soft, spongy and nontender; approx 2 cm diameter - FROM toes and ankle with intact ligamentous function, flexion/extension intact - no soft tissue swelling Skin: no bruise, erythema, ulceration, laceration or rash over L foot      Assessment & Plan:  L foot cyst - ?MT/T arthritis or other bursa.  No signs infection, no pain -  check xray given prior hx trauma (2007 5th MT fx repair - unlikely related to current issue): rule out bony abnormality and eval for DJD Pt educated on red flags/warning signs (pian, redness, increasing size) for which to call and we will make refer to ortho as needed

## 2011-07-14 NOTE — Patient Instructions (Signed)
It was good to see you today. Test(s) ordered today. Your results will be called to you after review. If any changes need to be made, you will be notified at that time. Keep compressive covering over cyst as needed and give it time to resorb - Call us if any problems as discussed for referral to foot specialist or [facility]

## 2011-10-08 ENCOUNTER — Other Ambulatory Visit: Payer: Self-pay | Admitting: Internal Medicine

## 2012-01-03 ENCOUNTER — Ambulatory Visit (INDEPENDENT_AMBULATORY_CARE_PROVIDER_SITE_OTHER): Payer: Commercial Managed Care - PPO | Admitting: Internal Medicine

## 2012-01-03 ENCOUNTER — Encounter: Payer: Self-pay | Admitting: Internal Medicine

## 2012-01-03 VITALS — BP 120/70 | HR 71 | Temp 97.0°F

## 2012-01-03 DIAGNOSIS — F17200 Nicotine dependence, unspecified, uncomplicated: Secondary | ICD-10-CM

## 2012-01-03 DIAGNOSIS — J209 Acute bronchitis, unspecified: Secondary | ICD-10-CM

## 2012-01-03 DIAGNOSIS — J180 Bronchopneumonia, unspecified organism: Secondary | ICD-10-CM

## 2012-01-03 DIAGNOSIS — E538 Deficiency of other specified B group vitamins: Secondary | ICD-10-CM

## 2012-01-03 MED ORDER — "SYRINGE/NEEDLE (DISP) 25G X 5/8"" 3 ML MISC": Status: DC

## 2012-01-03 MED ORDER — ALBUTEROL 90 MCG/ACT IN AERS: 2.0000 | INHALATION_SPRAY | Freq: Four times a day (QID) | RESPIRATORY_TRACT | Status: DC | PRN

## 2012-01-03 MED ORDER — LEVOFLOXACIN 500 MG PO TABS: 500.0000 mg | ORAL_TABLET | Freq: Every day | ORAL | Status: AC

## 2012-01-03 MED ORDER — HYDROCOD POLST-CHLORPHEN POLST 10-8 MG/5ML PO LQCR: 5.0000 mL | Freq: Two times a day (BID) | ORAL | Status: DC | PRN

## 2012-01-03 NOTE — Progress Notes (Signed)
  Subjective:    HPI  complains of cough> productive of yellow green sputum Onset 5 days ago, progressive symptoms  Initially associated with rhinorrhea, sneezing, sore throat, mild headache and low grade fever Also myalgias, sinus pressure and mild-mod chest congestion, worse at night No relief with OTC meds Precipitated by sick contacts  Past Medical History  Diagnosis Date  . VITAMIN B12 DEFICIENCY dx 08/2009  . DEPRESSION   . GERD   . IBS (irritable bowel syndrome)   . ALLERGIC RHINITIS     Review of Systems Constitutional: No fever or night sweats, no unexpected weight change Pulmonary: No pleurisy or hemoptysis Cardiovascular: No chest pain or palpitations     Objective:   Physical Exam BP 120/70  Pulse 71  Temp(Src) 97 F (36.1 C) (Oral)  SpO2 98% GEN: mildly ill appearing and audible head/chest congestion HENT: NCAT, mild sinus tenderness bilaterally, nares without clear discharge, oropharynx mild erythema, no exudate Eyes: Vision grossly intact, no conjunctivitis Lungs: L base rhonchi - mild exp wheeze, no increased work of breathing at rest Cardiovascular: Regular rate and rhythm, no bilateral edema      Assessment & Plan:  Bronchopneumonia Cough, related to above Bronchospasm - related to same and tobacco abuse B12 defic - refill supplies   Empiric antibiotics prescribed due to abnormal exam Prescription cough suppression - new prescriptions done Symptomatic care with Tylenol or Advil, hydration and rest -  salt gargle advised as needed Ventolin as needed for spasm and Smoking cessation advised

## 2012-01-03 NOTE — Patient Instructions (Signed)
It was good to see you today. Levaquin antibiotics and tussionex cough syrup - Your prescription(s) have been submitted to your pharmacy. Please take as directed and contact our office if you believe you are having problem(s) with the medication(s). Think about giving up those cigarettes as we discussed! Smoking is bad for your health!  Acute Bronchitis You have acute bronchitis. This means you have a chest cold. The airways in your lungs are red and sore (inflamed). Acute means it is sudden onset.   CAUSES Bronchitis is most often caused by the same virus that causes a cold. SYMPTOMS    Body aches.     Chest congestion.     Chills.    Cough.    Fever.    Shortness of breath.     Sore throat.  TREATMENT   Acute bronchitis is usually treated with rest, fluids, and medicines for relief of fever or cough. Most symptoms should go away after a few days or a week. Increased fluids may help thin your secretions and will prevent dehydration. Your caregiver may give you an inhaler to improve your symptoms. The inhaler reduces shortness of breath and helps control cough. You can take over-the-counter pain relievers or cough medicine to decrease coughing, pain, or fever. A cool-air vaporizer may help thin bronchial secretions and make it easier to clear your chest. Antibiotics are usually not needed but can be prescribed if you smoke, are seriously ill, have chronic lung problems, are elderly, or you are at higher risk for developing complications. Allergies and asthma can make bronchitis worse. Repeated episodes of bronchitis may cause longstanding lung problems. Avoid smoking and secondhand smoke. Exposure to cigarette smoke or irritating chemicals will make bronchitis worse. If you are a cigarette smoker, consider using nicotine gum or skin patches to help control withdrawal symptoms. Quitting smoking will help your lungs heal faster. Recovery from bronchitis is often slow, but you should start  feeling better after 2 to 3 days. Cough from bronchitis frequently lasts for 3 to 4 weeks. To prevent another bout of acute bronchitis:  Quit smoking.     Wash your hands frequently to get rid of viruses or use a hand sanitizer.     Avoid other people with cold or virus symptoms.     Try not to touch your hands to your mouth, nose, or eyes.  SEEK IMMEDIATE MEDICAL CARE IF:  You develop increased fever, chills, or chest pain.     You have severe shortness of breath or bloody sputum.     You develop dehydration, fainting, repeated vomiting, or a severe headache.     You have no improvement after 1 week of treatment or you get worse.  MAKE SURE YOU:    Understand these instructions.     Will watch your condition.     Will get help right away if you are not doing well or get worse.  Document Released: 12/16/2004 Document Revised: 07/21/2011 Document Reviewed: 03/03/2011 [facility] Patient Information 2012 [place], [place].Smoking Cessation This document explains the best ways for you to quit smoking and new treatments to help. It lists new medicines that can double or triple your chances of quitting and quitting for good. It also considers ways to avoid relapses and concerns you may have about quitting, including weight gain. NICOTINE: A POWERFUL ADDICTION If you have tried to quit smoking, you know how hard it can be. It is hard because nicotine is a very addictive drug. For some people, it can  be as addictive as heroin or cocaine. Usually, people make 2 or 3 tries, or more, before finally being able to quit. Each time you try to quit, you can learn about what helps and what hurts. Quitting takes hard work and a lot of effort, but you can quit smoking. QUITTING SMOKING IS ONE OF THE MOST IMPORTANT THINGS YOU WILL EVER DO.  You will live longer, feel better, and live better.     The impact on your body of quitting smoking is felt almost immediately:     Within 20 minutes, blood pressure  decreases. Pulse returns to its normal level.     After 8 hours, carbon monoxide levels in the blood return to normal. Oxygen level increases.     After 24 hours, chance of heart attack starts to decrease. Breath, hair, and body stop smelling like smoke.     After 48 hours, damaged nerve endings begin to recover. Sense of taste and smell improve.     After 72 hours, the body is virtually free of nicotine. Bronchial tubes relax and breathing becomes easier.     After 2 to 12 weeks, lungs can hold more air. Exercise becomes easier and circulation improves.     Quitting will reduce your risk of having a heart attack, stroke, cancer, or lung disease:     After 1 year, the risk of coronary heart disease is cut in half.     After 5 years, the risk of stroke falls to the same as a nonsmoker.     After 10 years, the risk of lung cancer is cut in half and the risk of other cancers decreases significantly.     After 15 years, the risk of coronary heart disease drops, usually to the level of a nonsmoker.     If you are pregnant, quitting smoking will improve your chances of having a healthy baby.     The people you live with, especially your children, will be healthier.     You will have extra money to spend on things other than cigarettes.  FIVE KEYS TO QUITTING Studies have shown that these 5 steps will help you quit smoking and quit for good. You have the best chances of quitting if you use them together: 1. Get ready.    2. Get support and encouragement.    3. Learn new skills and behaviors.    4. Get medicine to reduce your nicotine addiction and use it correctly.    5. Be prepared for relapse or difficult situations. Be determined to continue trying to quit, even if you do not succeed at first.  1. GET READY  Set a quit date.     Change your environment.     Get rid of ALL cigarettes, ashtrays, matches, and lighters in your home, car, and place of work.     Do not let people  smoke in your home.     Review your past attempts to quit. Think about what worked and what did not.     Once you quit, do not smoke. NOT EVEN A PUFF!  2. GET SUPPORT AND ENCOURAGEMENT Studies have shown that you have a better chance of being successful if you have help. You can get support in many ways.  Tell your family, friends, and coworkers that you are going to quit and need their support. Ask them not to smoke around you.     Talk to your caregivers (doctor, dentist, nurse, pharmacist, psychologist, and/or  smoking counselor).     Get individual, group, or telephone counseling and support. The more counseling you have, the better your chances are of quitting. Programs are available at [employer] and health centers. Call your local health department for information about programs in your area.     Spiritual beliefs and practices may help some smokers quit.     Quit meters are Photographer that keep track of quit statistics, such as amount of "quit-time," cigarettes not smoked, and money saved.     Many smokers find one or more of the many self-help books available useful in helping them quit and stay off tobacco.  3. LEARN NEW SKILLS AND BEHAVIORS  Try to distract yourself from urges to smoke. Talk to someone, go for a walk, or occupy your time with a task.     When you first try to quit, change your routine. Take a different route to work. Drink tea instead of coffee. Eat breakfast in a different place.     Do something to reduce your stress. Take a hot bath, exercise, or read a book.     Plan something enjoyable to do every day. Reward yourself for not smoking.     Explore interactive web-based programs that specialize in helping you quit.  4. GET MEDICINE AND USE IT CORRECTLY Medicines can help you stop smoking and decrease the urge to smoke. Combining medicine with the above behavioral methods and support can quadruple your chances of  successfully quitting smoking. The [place]. Food and Drug Administration (FDA) has approved 7 medicines to help you quit smoking. These medicines fall into 3 categories.  Nicotine replacement therapy (delivers nicotine to your body without the negative effects and risks of smoking):     Nicotine gum: Available over-the-counter.     Nicotine lozenges: Available over-the-counter.     Nicotine inhaler: Available by prescription.     Nicotine nasal spray: Available by prescription.     Nicotine skin patches (transdermal): Available by prescription and over-the-counter.     Antidepressant medicine (helps people abstain from smoking, but how this works is unknown):     Bupropion sustained-release (SR) tablets: Available by prescription.     Nicotinic receptor partial agonist (simulates the effect of nicotine in your brain):     Varenicline tartrate tablets: Available by prescription.     Ask your caregiver for advice about which medicines to use and how to use them. Carefully read the information on the package.     Everyone who is trying to quit may benefit from using a medicine. If you are pregnant or trying to become pregnant, nursing an infant, you are under age 40, or you smoke fewer than 10 cigarettes per day, talk to your caregiver before taking any nicotine replacement medicines.     You should stop using a nicotine replacement product and call your caregiver if you experience nausea, dizziness, weakness, vomiting, fast or irregular heartbeat, mouth problems with the lozenge or gum, or redness or swelling of the skin around the patch that does not go away.     Do not use any other product containing nicotine while using a nicotine replacement product.     Talk to your caregiver before using these products if you have diabetes, heart disease, asthma, stomach ulcers, you had a recent heart attack, you have high blood pressure that is not controlled with medicine, a history of irregular  heartbeat, or you have been prescribed medicine to help  you quit smoking.  5. BE PREPARED FOR RELAPSE OR DIFFICULT SITUATIONS  Most relapses occur within the first 3 months after quitting. Do not be discouraged if you start smoking again. Remember, most people try several times before they finally quit.     You may have symptoms of withdrawal because your body is used to nicotine. You may crave cigarettes, be irritable, feel very hungry, cough often, get headaches, or have difficulty concentrating.     The withdrawal symptoms are only temporary. They are strongest when you first quit, but they will go away within 10 to 14 days.  Here are some difficult situations to watch for:  Alcohol. Avoid drinking alcohol. Drinking lowers your chances of successfully quitting.     Caffeine. Try to reduce the amount of caffeine you consume. It also lowers your chances of successfully quitting.     Other smokers. Being around smoking can make you want to smoke. Avoid smokers.     Weight gain. Many smokers will gain weight when they quit, usually less than 10 pounds. Eat a healthy diet and stay active. Do not let weight gain distract you from your main goal, quitting smoking. Some medicines that help you quit smoking may also help delay weight gain. You can always lose the weight gained after you quit.     Bad mood or depression. There are a lot of ways to improve your mood other than smoking.  If you are having problems with any of these situations, talk to your caregiver. SPECIAL SITUATIONS AND CONDITIONS Studies suggest that everyone can quit smoking. Your situation or condition can give you a special reason to quit.  Pregnant women/new mothers: By quitting, you protect your baby's health and your own.     Hospitalized patients: By quitting, you reduce health problems and help healing.     Heart attack patients: By quitting, you reduce your risk of a second heart attack.     Lung, head, and neck  cancer patients: By quitting, you reduce your chance of a second cancer.     Parents of children and adolescents: By quitting, you protect your children from illnesses caused by secondhand smoke.  QUESTIONS TO THINK ABOUT Think about the following questions before you try to stop smoking. You may want to talk about your answers with your caregiver.  Why do you want to quit?     If you tried to quit in the past, what helped and what did not?     What will be the most difficult situations for you after you quit? How will you plan to handle them?     Who can help you through the tough times? Your family? Friends? Caregiver?     What pleasures do you get from smoking? What ways can you still get pleasure if you quit?  Here are some questions to ask your caregiver:  How can you help me to be successful at quitting?     What medicine do you think would be best for me and how should I take it?     What should I do if I need more help?     What is smoking withdrawal like? How can I get information on withdrawal?  Quitting takes hard work and a lot of effort, but you can quit smoking. FOR MORE INFORMATION   Smokefree.gov (http://www.davis-sullivan.com/) provides free, accurate, evidence-based information and professional assistance to help support the immediate and long-term needs of people trying to quit smoking. Document Released:  11/02/2001 Document Revised: 07/21/2011 Document Reviewed: 08/25/2009 [facility] Patient Information 2012 [place], [place].

## 2012-03-09 ENCOUNTER — Other Ambulatory Visit: Payer: Self-pay | Admitting: Internal Medicine

## 2012-04-01 ENCOUNTER — Other Ambulatory Visit: Payer: Self-pay | Admitting: Internal Medicine

## 2012-07-03 ENCOUNTER — Other Ambulatory Visit: Payer: Self-pay | Admitting: Internal Medicine

## 2012-07-03 MED ORDER — CYANOCOBALAMIN 1000 MCG/ML IJ SOLN: 1000.0000 ug | INTRAMUSCULAR | Status: DC

## 2012-07-03 NOTE — Telephone Encounter (Signed)
Called pt no answer LMOM rx [facility]... 07/03/12@11 :31am/LMB

## 2012-07-03 NOTE — Telephone Encounter (Signed)
The pt called the triage line and is hoping to get a refill of a vitamin B.  She states last time she only received one dose, and she is hoping for a refill of 10 doses.  Thanks!   Pt's callback - [phone]

## 2012-07-28 ENCOUNTER — Other Ambulatory Visit: Payer: Self-pay | Admitting: Internal Medicine

## 2013-01-31 ENCOUNTER — Other Ambulatory Visit: Payer: Self-pay | Admitting: Internal Medicine

## 2013-03-30 ENCOUNTER — Other Ambulatory Visit: Payer: Self-pay | Admitting: Internal Medicine

## 2013-06-06 ENCOUNTER — Other Ambulatory Visit: Payer: Self-pay | Admitting: Internal Medicine

## 2013-07-11 ENCOUNTER — Other Ambulatory Visit: Payer: Self-pay | Admitting: Internal Medicine

## 2013-09-10 ENCOUNTER — Other Ambulatory Visit: Payer: Self-pay | Admitting: Internal Medicine

## 2013-09-14 ENCOUNTER — Telehealth: Payer: Self-pay

## 2013-09-14 NOTE — Telephone Encounter (Signed)
Called pt no answer LMOM need office visit. MD hasn't seen pt in over a year....lmb

## 2013-09-14 NOTE — Telephone Encounter (Signed)
The patient called the triage line hoping to get a refill of Sertraline 50mg .   Thanks!

## 2013-09-17 ENCOUNTER — Encounter: Payer: Self-pay | Admitting: Internal Medicine

## 2013-09-17 ENCOUNTER — Ambulatory Visit (INDEPENDENT_AMBULATORY_CARE_PROVIDER_SITE_OTHER): Payer: Commercial Managed Care - PPO | Admitting: Internal Medicine

## 2013-09-17 VITALS — BP 100/78 | HR 74 | Temp 98.3°F | Wt 179.4 lb

## 2013-09-17 DIAGNOSIS — Z23 Encounter for immunization: Secondary | ICD-10-CM

## 2013-09-17 DIAGNOSIS — F3289 Other specified depressive episodes: Secondary | ICD-10-CM

## 2013-09-17 DIAGNOSIS — F329 Major depressive disorder, single episode, unspecified: Secondary | ICD-10-CM

## 2013-09-17 DIAGNOSIS — Z0000 Encounter for general adult medical examination without abnormal findings: Secondary | ICD-10-CM

## 2013-09-17 MED ORDER — SERTRALINE HCL 100 MG PO TABS: 100.0000 mg | ORAL_TABLET | Freq: Every day | ORAL | Status: DC

## 2013-09-17 MED ORDER — "SYRINGE/NEEDLE (DISP) 25G X 5/8"" 3 ML MISC": Status: DC

## 2013-09-17 MED ORDER — CYANOCOBALAMIN 1000 MCG/ML IJ SOLN: INTRAMUSCULAR | Status: DC

## 2013-09-17 NOTE — Progress Notes (Signed)
Subjective:    Patient ID: Teresa Monroe, female    DOB: 09-18-1956, 57 y.o.   MRN: [id number]  HPI  Patient here today for annual wellness exam.    Also chronic medical issues reviewed/addressed -  Allergic rhinitis - symptoms currently controlled on Loratadine.  Pt reports compliance with current therapy and no adverse effects.  Anxiety/depression - pt on Sertraline low dose for the past year.  States that she feels like her mood is more labile recently than has been.  Wants to discuss increasing dose. Denies SI/HI  Past Medical History  Diagnosis Date  . VITAMIN B12 DEFICIENCY dx 08/2009  . DEPRESSION   . GERD   . IBS (irritable bowel syndrome)   . ALLERGIC RHINITIS     Family History  Problem Relation Age of Onset  . Lung cancer Father   . Colon cancer Brother    Past Surgical History  Procedure Laterality Date  . Cesarean section      x's 2  . Broken foot-fifthe metatarsal  2007   History  Substance Use Topics  . Smoking status: Current Every Day Smoker -- 1.00 packs/day  . Smokeless tobacco: Not on file     Comment: Divorced, lives with dtr-21 and son 70. Works at [employer]  . Alcohol Use: No    Review of Systems  Constitutional: Negative for fever, chills, activity change and appetite change.  HENT: Negative for congestion, ear pain, rhinorrhea and sinus pressure.   Eyes: Negative for discharge and itching.  Respiratory: Negative for chest tightness, shortness of breath and wheezing.   Cardiovascular: Negative for chest pain, palpitations and leg swelling.  Gastrointestinal: Negative for nausea, vomiting, diarrhea and constipation.  Endocrine: Negative for cold intolerance, heat intolerance, polydipsia and polyuria.  Genitourinary: Negative for dysuria, urgency, flank pain and difficulty urinating.  Musculoskeletal: Negative for arthralgias, joint swelling and myalgias.  Skin: Negative for rash.  Neurological: Negative for dizziness,  syncope, light-headedness and headaches.  Psychiatric/Behavioral: Negative for suicidal ideas.  All other systems reviewed and are negative.       Objective:   Physical Exam  Constitutional: She is oriented to person, place, and time. She appears well-developed and well-nourished. No distress.  HENT:  Head: Normocephalic and atraumatic.  Right Ear: External ear normal.  Left Ear: External ear normal.  Nose: Nose normal.  Mouth/Throat: Oropharynx is clear and moist. No oropharyngeal exudate.  Eyes: Conjunctivae and EOM are normal. Pupils are equal, round, and reactive to light. Right eye exhibits no discharge. Left eye exhibits no discharge. No scleral icterus.  Neck: Normal range of motion. Neck supple. No thyromegaly present.  Cardiovascular: Normal rate, regular rhythm and normal heart sounds.   Pulmonary/Chest: Effort normal and breath sounds normal. No respiratory distress. She has no wheezes.  Abdominal: Soft. Bowel sounds are normal. She exhibits no distension. There is no tenderness.  Musculoskeletal: Normal range of motion. She exhibits no edema and no tenderness.  Lymphadenopathy:    She has no cervical adenopathy.  Neurological: She is alert and oriented to person, place, and time.  Skin: Skin is warm and dry. No rash noted. She is not diaphoretic.  Psychiatric: She has a normal mood and affect. Her behavior is normal. Judgment and thought content normal.    Wt Readings from Last 3 Encounters:  09/17/13 179 lb 6.4 oz (81.375 kg)  03/08/11 166 lb 3.2 oz (75.388 kg)  01/04/11 165 lb (74.844 kg)   BP Readings from Last 3 Encounters:  09/17/13  100/78  01/03/12 120/70  07/14/11 102/68   Lab Results  Component Value Date   WBC 6.4 01/04/2011   HGB 12.6 01/04/2011   HCT 36.7 01/04/2011   PLT 305.0 01/04/2011   GLUCOSE 82 01/04/2011   ALT 18 01/04/2011   AST 24 01/04/2011   NA 139 01/04/2011   K 4.4 01/04/2011   CL 100 01/04/2011   CREATININE 1.0 01/04/2011   BUN 13  01/04/2011   CO2 27 01/04/2011   TSH 0.81 01/04/2011       Assessment & Plan:    CPX/v70.0 - Patient has been counseled on age-appropriate routine health concerns for screening and prevention. These are reviewed and up-to-date. Immunizations are up-to-date or declined. Labs ordered and reviewed.  Also See problem list. Medications and labs reviewed today.

## 2013-09-17 NOTE — Assessment & Plan Note (Signed)
Situational stressors explored and reviewed. Denies SI/HI Previous improvement with doubling of sertraline dose, we'll do same again now 100 mg daily - ex done Patient agrees to call if symptoms unimproved in next 6 weeks, sooner if worse

## 2013-09-17 NOTE — Patient Instructions (Addendum)
It was good to see you today.  We have reviewed your prior records including labs and tests today  Tetanus (Tdap) updated today. Consider the flu vaccinations were discussed and reviewed with your insurance regarding coverage for shingles vaccination prior to age 56  Health Maintenance reviewed - all recommended immunizations and age-appropriate screenings are up-to-date.  Medications reviewed and updated, okay to increase sertraline to 100 mg daily - no other changes recommended at this time.  Your prescription(s) have been submitted to your pharmacy. Please take as directed and contact our office if you believe you are having problem(s) with the medication(s). Also refill on medication(s) as discussed today.  Test(s) ordered today. Please return one morning this week when you're fasting. Your results will be released to MyChart (or called to you) after review, usually within 72hours after test completion. If any changes need to be made, you will be notified at that same time.  Please schedule followup in 12 months for annual exam, call sooner if problems.  Health Maintenance, Females A healthy lifestyle and preventative care can promote health and wellness.  Maintain regular health, dental, and eye exams.  Eat a healthy diet. Foods like vegetables, fruits, whole grains, low-fat dairy products, and lean protein foods contain the nutrients you need without too many calories. Decrease your intake of foods high in solid fats, added sugars, and salt. Get information about a proper diet from your caregiver, if necessary.  Regular physical exercise is one of the most important things you can do for your health. Most adults should get at least 150 minutes of moderate-intensity exercise (any activity that increases your heart rate and causes you to sweat) each week. In addition, most adults need muscle-strengthening exercises on 2 or more days a week.   Maintain a healthy weight. The body mass  index (BMI) is a screening tool to identify possible weight problems. It provides an estimate of body fat based on height and weight. Your caregiver can help determine your BMI, and can help you achieve or maintain a healthy weight. For adults 20 years and older:  A BMI below 18.5 is considered underweight.  A BMI of 18.5 to 24.9 is normal.  A BMI of 25 to 29.9 is considered overweight.  A BMI of 30 and above is considered obese.  Maintain normal blood lipids and cholesterol by exercising and minimizing your intake of saturated fat. Eat a balanced diet with plenty of fruits and vegetables. Blood tests for lipids and cholesterol should begin at age 64 and be repeated every 5 years. If your lipid or cholesterol levels are high, you are over 50, or you are a high risk for heart disease, you may need your cholesterol levels checked more frequently.Ongoing high lipid and cholesterol levels should be treated with medicines if diet and exercise are not effective.  If you smoke, find out from your caregiver how to quit. If you do not use tobacco, do not start.  If you are pregnant, do not drink alcohol. If you are breastfeeding, be very cautious about drinking alcohol. If you are not pregnant and choose to drink alcohol, do not exceed 1 drink per day. One drink is considered to be 12 ounces (355 mL) of beer, 5 ounces (148 mL) of wine, or 1.5 ounces (44 mL) of liquor.  Avoid use of street drugs. Do not share needles with anyone. Ask for help if you need support or instructions about stopping the use of drugs.  High blood pressure causes  heart disease and increases the risk of stroke. Blood pressure should be checked at least every 1 to 2 years. Ongoing high blood pressure should be treated with medicines, if weight loss and exercise are not effective.  If you are 3 to 56 years old, ask your caregiver if you should take aspirin to prevent strokes.  Diabetes screening involves taking a blood sample to  check your fasting blood sugar level. This should be done once every 3 years, after age 53, if you are within normal weight and without risk factors for diabetes. Testing should be considered at a younger age or be carried out more frequently if you are overweight and have at least 1 risk factor for diabetes.  Breast cancer screening is essential preventative care for women. You should practice "breast self-awareness." This means understanding the normal appearance and feel of your breasts and may include breast self-examination. Any changes detected, no matter how small, should be reported to a caregiver. Women in their 91s and 30s should have a clinical breast exam (CBE) by a caregiver as part of a regular health exam every 1 to 3 years. After age 91, women should have a CBE every year. Starting at age 61, women should consider having a mammogram (breast X-ray) every year. Women who have a family history of breast cancer should talk to their caregiver about genetic screening. Women at a high risk of breast cancer should talk to their caregiver about having an MRI and a mammogram every year.  The Pap test is a screening test for cervical cancer. Women should have a Pap test starting at age 44. Between ages 75 and 56, Pap tests should be repeated every 2 years. Beginning at age 31, you should have a Pap test every 3 years as long as the past 3 Pap tests have been normal. If you had a hysterectomy for a problem that was not cancer or a condition that could lead to cancer, then you no longer need Pap tests. If you are between ages 31 and 18, and you have had normal Pap tests going back 10 years, you no longer need Pap tests. If you have had past treatment for cervical cancer or a condition that could lead to cancer, you need Pap tests and screening for cancer for at least 20 years after your treatment. If Pap tests have been discontinued, risk factors (such as a new sexual partner) need to be reassessed to  determine if screening should be resumed. Some women have medical problems that increase the chance of getting cervical cancer. In these cases, your caregiver may recommend more frequent screening and Pap tests.  The human papillomavirus (HPV) test is an additional test that may be used for cervical cancer screening. The HPV test looks for the virus that can cause the cell changes on the cervix. The cells collected during the Pap test can be tested for HPV. The HPV test could be used to screen women aged 70 years and older, and should be used in women of any age who have unclear Pap test results. After the age of 65, women should have HPV testing at the same frequency as a Pap test.  Colorectal cancer can be detected and often prevented. Most routine colorectal cancer screening begins at the age of 52 and continues through age 8. However, your caregiver may recommend screening at an earlier age if you have risk factors for colon cancer. On a yearly basis, your caregiver may provide home test kits  to check for hidden blood in the stool. Use of a small camera at the end of a tube, to directly examine the colon (sigmoidoscopy or colonoscopy), can detect the earliest forms of colorectal cancer. Talk to your caregiver about this at age 75, when routine screening begins. Direct examination of the colon should be repeated every 5 to 10 years through age 77, unless early forms of pre-cancerous polyps or small growths are found.  Hepatitis C blood testing is recommended for all people born from [birth year] through 1964 and any individual with known risks for hepatitis C.  Practice safe sex. Use condoms and avoid high-risk sexual practices to reduce the spread of sexually transmitted infections (STIs). Sexually active women aged 43 and younger should be checked for Chlamydia, which is a common sexually transmitted infection. Older women with new or multiple partners should also be tested for Chlamydia. Testing for other  STIs is recommended if you are sexually active and at increased risk.  Osteoporosis is a disease in which the bones lose minerals and strength with aging. This can result in serious bone fractures. The risk of osteoporosis can be identified using a bone density scan. Women ages 34 and over and women at risk for fractures or osteoporosis should discuss screening with their caregivers. Ask your caregiver whether you should be taking a calcium supplement or vitamin D to reduce the rate of osteoporosis.  Menopause can be associated with physical symptoms and risks. Hormone replacement therapy is available to decrease symptoms and risks. You should talk to your caregiver about whether hormone replacement therapy is right for you.  Use sunscreen with a sun protection factor (SPF) of 30 or greater. Apply sunscreen liberally and repeatedly throughout the day. You should seek shade when your shadow is shorter than you. Protect yourself by wearing long sleeves, pants, a wide-brimmed hat, and sunglasses year round, whenever you are outdoors.  Notify your caregiver of new moles or changes in moles, especially if there is a change in shape or color. Also notify your caregiver if a mole is larger than the size of a pencil eraser.  Stay current with your immunizations. Document Released: 05/24/2011 Document Revised: 01/31/2012 Document Reviewed: 05/24/2011 [facility] Patient Information 2014 [place], [place].

## 2013-09-24 ENCOUNTER — Encounter: Payer: Self-pay | Admitting: Internal Medicine

## 2013-09-24 ENCOUNTER — Other Ambulatory Visit (INDEPENDENT_AMBULATORY_CARE_PROVIDER_SITE_OTHER): Payer: Commercial Managed Care - PPO

## 2013-09-24 DIAGNOSIS — E785 Hyperlipidemia, unspecified: Secondary | ICD-10-CM | POA: Insufficient documentation

## 2013-09-24 DIAGNOSIS — Z0000 Encounter for general adult medical examination without abnormal findings: Secondary | ICD-10-CM

## 2013-09-24 LAB — HEPATIC FUNCTION PANEL
Alkaline Phosphatase: 61 U/L (ref 39–117)
Bilirubin, Direct: 0.1 mg/dL (ref 0.0–0.3)
Total Bilirubin: 0.7 mg/dL (ref 0.3–1.2)
Total Protein: 7 g/dL (ref 6.0–8.3)

## 2013-09-24 LAB — BASIC METABOLIC PANEL
BUN: 13 mg/dL (ref 6–23)
CO2: 29 mEq/L (ref 19–32)
Calcium: 9.2 mg/dL (ref 8.4–10.5)
Chloride: 103 mEq/L (ref 96–112)
Creatinine, Ser: 0.8 mg/dL (ref 0.4–1.2)
Potassium: 4.3 mEq/L (ref 3.5–5.1)
Sodium: 138 mEq/L (ref 135–145)

## 2013-09-24 LAB — CBC WITH DIFFERENTIAL/PLATELET
Basophils Absolute: 0 10*3/uL (ref 0.0–0.1)
Basophils Relative: 0.4 % (ref 0.0–3.0)
Eosinophils Absolute: 0.2 10*3/uL (ref 0.0–0.7)
Eosinophils Relative: 3.7 % (ref 0.0–5.0)
HCT: 41.8 % (ref 36.0–46.0)
Lymphocytes Relative: 28.1 % (ref 12.0–46.0)
Lymphs Abs: 1.4 10*3/uL (ref 0.7–4.0)
MCHC: 34.9 g/dL (ref 30.0–36.0)
MCV: 100.5 fl — ABNORMAL HIGH (ref 78.0–100.0)
Monocytes Absolute: 0.4 10*3/uL (ref 0.1–1.0)
Monocytes Relative: 7.5 % (ref 3.0–12.0)
Neutrophils Relative %: 60.3 % (ref 43.0–77.0)
Platelets: 293 10*3/uL (ref 150.0–400.0)
WBC: 5.1 10*3/uL (ref 4.5–10.5)

## 2013-09-24 LAB — LIPID PANEL
HDL: 69.7 mg/dL (ref >39.00)
Total CHOL/HDL Ratio: 4
VLDL: 26 mg/dL (ref 0.0–40.0)

## 2013-09-24 LAB — LDL CHOLESTEROL, DIRECT: Direct LDL: 174 mg/dL

## 2013-09-24 LAB — URINALYSIS, ROUTINE W REFLEX MICROSCOPIC
Bilirubin Urine: NEGATIVE
Leukocytes, UA: NEGATIVE
Nitrite: NEGATIVE
Specific Gravity, Urine: 1.015 (ref 1.000–1.030)
Total Protein, Urine: NEGATIVE
Urine Glucose: NEGATIVE
pH: 6 (ref 5.0–8.0)

## 2013-09-24 LAB — TSH: TSH: 1.46 u[IU]/mL (ref 0.35–5.50)

## 2013-11-27 ENCOUNTER — Telehealth: Payer: Self-pay | Admitting: Internal Medicine

## 2013-11-27 NOTE — Telephone Encounter (Signed)
Patient Information:  Caller Name: Santina EvansCatherine  Phone: [phone]  Patient: Teresa Monroe, [name]  Gender: Female  DOB: 03/18/1956  Age: 58 Years  PCP: Rene PaciLeschber, Valerie (Adults only)  Office Follow Up:  Does the office need to follow up with this patient?: Yes  Instructions For The Office: Pt is requsting advice/medication to help with tickle in throat.  See symptoms below and follow up with pt.   Symptoms  Reason For Call & Symptoms: 11/14/13 chest congestion with cough, nasal congestion.  11/27/13 feels better but continues to have a lingering cough due to a constant tickle in throat no further chest congestion, only coughs up some white mucous only in the morning, no nasal congestion, no wheezing, afebrile,   Taking cough drops, Delsym, Mucinex with no relief.   Advised pt of need for appt but pt is requesting if Dr Felicity CoyerLeschber can recommend/prescribe something to help with the lingering tickle causing the cough.  ([facility]  Reviewed Health History In EMR: Yes  Reviewed Medications In EMR: Yes  Reviewed Allergies In EMR: Yes  Reviewed Surgeries / Procedures: Yes  Date of Onset of Symptoms: 11/14/2013  Treatments Tried: delsym, mucinex, cough drops.  Treatments Tried Worked: No  Guideline(s) Used:  Cough  Disposition Per Guideline:   See Within 3 Days in Office  Reason For Disposition Reached:   Cough has been present for > 10 days  Advice Given:  Cough Medicines:  OTC Cough Syrups: The most common cough suppressant in OTC cough medications is dextromethorphan. Often the letters "DM" appear in the name.  OTC Cough Drops: Cough drops can help a lot, especially for mild coughs. They reduce coughing by soothing your irritated throat and removing that tickle sensation in the back of the throat. Cough drops also have the advantage of portability - you can carry them with you.  Home Remedy - Hard Candy: Hard candy works just as well as medicine-flavored OTC cough  drops. Diabetics should use sugar-free candy.  Home Remedy - Honey: This old home remedy has been shown to help decrease coughing at night. The adult dosage is 2 teaspoons (10 ml) at bedtime. Honey should not be given to infants under one year of age.  Coughing Spasms:  Drink warm fluids. Inhale warm mist (Reason: both relax the airway and loosen up the phlegm).  Suck on cough drops or hard candy to coat the irritated throat.  Prevent Dehydration:  Drink adequate liquids.  Call Back If:  Difficulty breathing  You become worse.  Patient Refused Recommendation:  Patient Requests Prescription  Pt is requesting Dr Diamantina MonksLeschber's recommendations / medication to help with tickle in throat.

## 2013-11-28 MED ORDER — BENZONATATE 200 MG PO CAPS: 200.0000 mg | ORAL_CAPSULE | Freq: Three times a day (TID) | ORAL | Status: DC | PRN

## 2013-11-28 NOTE — Telephone Encounter (Signed)
Called pt no answer LMOM md [facility].../lmb

## 2013-11-28 NOTE — Telephone Encounter (Signed)
Yes - tessalon TID prn cough - erx done

## 2013-12-03 ENCOUNTER — Telehealth: Payer: Self-pay | Admitting: Internal Medicine

## 2013-12-03 MED ORDER — ALBUTEROL SULFATE HFA 108 (90 BASE) MCG/ACT IN AERS: 2.0000 | INHALATION_SPRAY | Freq: Four times a day (QID) | RESPIRATORY_TRACT | Status: DC | PRN

## 2013-12-03 NOTE — Telephone Encounter (Signed)
Pt is requesting to refill an inhaler for a wheezing and cough[facility] [address]

## 2013-12-03 NOTE — Telephone Encounter (Signed)
[facility].../lmb

## 2014-01-13 ENCOUNTER — Other Ambulatory Visit: Payer: Self-pay | Admitting: Internal Medicine

## 2014-10-13 ENCOUNTER — Other Ambulatory Visit: Payer: Self-pay | Admitting: Internal Medicine

## 2014-11-21 ENCOUNTER — Telehealth: Payer: Self-pay | Admitting: Internal Medicine

## 2014-11-21 MED ORDER — SERTRALINE HCL 100 MG PO TABS: 100.0000 mg | ORAL_TABLET | Freq: Every day | ORAL | Status: DC

## 2014-11-21 NOTE — Telephone Encounter (Signed)
Called pt no answer LMOM (W) can only send in 30 day she is overdue for cpx last saw md 08/2013...Raechel Chute/lmb

## 2014-11-21 NOTE — Telephone Encounter (Signed)
Pt needs Sertraline refill, she lost bottle. Pt has not taken in two days, requesting be called in. [phone] pt's cell# (let pt know)

## 2014-11-25 ENCOUNTER — Other Ambulatory Visit: Payer: Self-pay

## 2014-11-25 NOTE — Telephone Encounter (Signed)
rx refill for cyanocobalamin vial. LOV was in 2014. No upcoming appt. Please advise if refill is appropriate.

## 2014-11-26 NOTE — Telephone Encounter (Signed)
Okay to send 90 day supply, no additional refills until office visit occurs, with me or any provider Please let patient know same Thanks

## 2014-11-28 MED ORDER — CYANOCOBALAMIN 1000 MCG/ML IJ SOLN: INTRAMUSCULAR | Status: DC

## 2014-12-26 ENCOUNTER — Other Ambulatory Visit: Payer: Self-pay | Admitting: Internal Medicine

## 2015-01-15 ENCOUNTER — Ambulatory Visit: Payer: Commercial Managed Care - PPO | Admitting: Family

## 2015-01-16 ENCOUNTER — Encounter: Payer: Self-pay | Admitting: Family

## 2015-01-16 ENCOUNTER — Telehealth: Payer: Self-pay | Admitting: Internal Medicine

## 2015-01-16 ENCOUNTER — Ambulatory Visit (INDEPENDENT_AMBULATORY_CARE_PROVIDER_SITE_OTHER): Payer: Commercial Managed Care - PPO | Admitting: Family

## 2015-01-16 VITALS — BP 144/82 | HR 92 | Temp 97.5°F | Ht 67.0 in | Wt 179.8 lb

## 2015-01-16 DIAGNOSIS — F329 Major depressive disorder, single episode, unspecified: Secondary | ICD-10-CM

## 2015-01-16 DIAGNOSIS — F418 Other specified anxiety disorders: Secondary | ICD-10-CM

## 2015-01-16 DIAGNOSIS — E538 Deficiency of other specified B group vitamins: Secondary | ICD-10-CM

## 2015-01-16 DIAGNOSIS — F32A Depression, unspecified: Secondary | ICD-10-CM

## 2015-01-16 DIAGNOSIS — F419 Anxiety disorder, unspecified: Principal | ICD-10-CM

## 2015-01-16 MED ORDER — CYANOCOBALAMIN 1000 MCG/ML IJ SOLN: INTRAMUSCULAR | Status: DC

## 2015-01-16 MED ORDER — SERTRALINE HCL 100 MG PO TABS: 100.0000 mg | ORAL_TABLET | Freq: Every day | ORAL | Status: DC

## 2015-01-16 MED ORDER — "SYRINGE/NEEDLE (DISP) 25G X 5/8"" 3 ML MISC": Status: DC

## 2015-01-16 MED ORDER — "TUBERCULIN SYRINGE 25G X 5/8"" 1 ML MISC": Status: DC

## 2015-01-16 NOTE — Telephone Encounter (Signed)
emmi emailed °

## 2015-01-16 NOTE — Patient Instructions (Signed)
Thank you for choosing [facility].  Summary/Instructions:  Your prescription(s) have been submitted to your pharmacy or been printed and provided for you. Please take as directed and contact our office if you believe you are having problem(s) with the medication(s) or have any questions.    

## 2015-01-16 NOTE — Progress Notes (Signed)
Pre visit review using our clinic review tool, if applicable. No additional management support is needed unless otherwise documented below in the visit note. 

## 2015-01-16 NOTE — Progress Notes (Signed)
   Subjective:    Patient ID: Teresa Monroe, female    DOB: 08/27/1956, 59 y.o.   MRN: [id number]  Chief Complaint  Patient presents with  . Medication Refill    B12 and Zoloft  . Follow-up    Anxiety    HPI:  Teresa Monroe is a 59 y.o. female who presents today for medication refill.  1) Anxiety/Depression - Indicates that the anxiety is stable with the current regimen of zoloft and improved. Denies any associated symptoms of anxiety.   2) B12 deficiency - Indicates that is currently stable and provides the injections at home. Denies any associated symptoms of fatigue or numbness.    No Known Allergies   Current Outpatient Prescriptions  Medication Sig Dispense Refill  . cyanocobalamin (,VITAMIN B-12,) 1000 MCG/ML injection INJECT 1 ML (1,000 MCG TOTAL) INTO THE MUSCLE EVERY 30 (THIRTY) DAYS. 3 mL 1  . Loratadine 10 MG CAPS Take by mouth at bedtime.      . sertraline (ZOLOFT) 100 MG tablet Take 1 tablet (100 mg total) by mouth daily. 90 tablet 1  . SYRINGE-NEEDLE, DISP, 3 ML (B-D SYRINGE/NEEDLE 3CC/25GX5/8) 25G X 5/8" 3 ML MISC Use monthly 12 each 0  . TUBERCULIN SYR 1CC/25GX5/8" (B-D TB SYRINGE 1CC/25GX5/8") 25G X 5/8" 1 ML MISC USE EVERY MONTH FOR B12 INJECTION 12 each 0   No current facility-administered medications for this visit.    Review of Systems  Constitutional: Negative for fatigue.  Neurological: Negative for numbness.  Psychiatric/Behavioral: Negative for suicidal ideas and dysphoric mood. The patient is not nervous/anxious.       Objective:    BP 144/82 mmHg  Pulse 92  Temp(Src) 97.5 F (36.4 C) (Oral)  Ht 5\' 7"  (1.702 m)  Wt 179 lb 12 oz (81.534 kg)  BMI 28.15 kg/m2  SpO2 97% Nursing note and vital signs reviewed.  Physical Exam  Constitutional: She is oriented to person, place, and time. She appears well-developed and well-nourished. No distress.  Cardiovascular: Normal rate, regular rhythm, normal heart sounds and intact distal pulses.     Pulmonary/Chest: Effort normal and breath sounds normal.  Neurological: She is alert and oriented to person, place, and time.  Skin: Skin is warm and dry.  Psychiatric: She has a normal mood and affect. Her behavior is normal. Judgment and thought content normal.       Assessment & Plan:

## 2015-01-16 NOTE — Assessment & Plan Note (Signed)
Stable with current regimen of Zoloft. Denies any adverse effects of medications. Continue current dosage of Zoloft. Medication refilled.

## 2015-01-16 NOTE — Assessment & Plan Note (Signed)
B12 deficiency stable with current regimen. Patient denies any numbness or decreases in energy. Refill B12 injections.

## 2015-04-11 ENCOUNTER — Other Ambulatory Visit: Payer: Self-pay | Admitting: Family

## 2015-04-15 ENCOUNTER — Telehealth: Payer: Self-pay | Admitting: Internal Medicine

## 2015-04-15 MED ORDER — SERTRALINE HCL 100 MG PO TABS: 100.0000 mg | ORAL_TABLET | Freq: Every day | ORAL | Status: DC

## 2015-04-15 NOTE — Telephone Encounter (Signed)
erx done

## 2015-04-15 NOTE — Telephone Encounter (Signed)
Patient is requesting refill for sertraline (ZOLOFT) 100 MG tablet [[id number]. Her appointment isn't until 7/27 and she only has 4 pills left. [facility] [address]

## 2015-05-15 ENCOUNTER — Other Ambulatory Visit: Payer: Self-pay | Admitting: Family

## 2015-06-18 ENCOUNTER — Ambulatory Visit (INDEPENDENT_AMBULATORY_CARE_PROVIDER_SITE_OTHER): Payer: Commercial Managed Care - PPO | Admitting: Internal Medicine

## 2015-06-18 ENCOUNTER — Encounter: Payer: Self-pay | Admitting: Internal Medicine

## 2015-06-18 ENCOUNTER — Other Ambulatory Visit (INDEPENDENT_AMBULATORY_CARE_PROVIDER_SITE_OTHER): Payer: Commercial Managed Care - PPO

## 2015-06-18 VITALS — BP 112/78 | HR 79 | Temp 97.7°F | Ht 67.0 in | Wt 173.5 lb

## 2015-06-18 DIAGNOSIS — F17200 Nicotine dependence, unspecified, uncomplicated: Secondary | ICD-10-CM

## 2015-06-18 DIAGNOSIS — Z0000 Encounter for general adult medical examination without abnormal findings: Secondary | ICD-10-CM | POA: Diagnosis not present

## 2015-06-18 DIAGNOSIS — Z720 Tobacco use: Secondary | ICD-10-CM

## 2015-06-18 DIAGNOSIS — Z1239 Encounter for other screening for malignant neoplasm of breast: Secondary | ICD-10-CM | POA: Diagnosis not present

## 2015-06-18 LAB — BASIC METABOLIC PANEL
BUN: 10 mg/dL (ref 6–23)
CALCIUM: 9.4 mg/dL (ref 8.4–10.5)
CHLORIDE: 104 meq/L (ref 96–112)
CO2: 29 mEq/L (ref 19–32)
CREATININE: 0.75 mg/dL (ref 0.40–1.20)
GFR: 84.34 mL/min (ref >60.00)
GLUCOSE: 84 mg/dL (ref 70–99)
Potassium: 4.3 mEq/L (ref 3.5–5.1)
SODIUM: 140 meq/L (ref 135–145)

## 2015-06-18 LAB — LIPID PANEL
CHOL/HDL RATIO: 3
CHOLESTEROL: 253 mg/dL — AB (ref 0–200)
HDL: 79.9 mg/dL (ref >39.00)
LDL Cholesterol: 156 mg/dL — ABNORMAL HIGH (ref 0–99)
NONHDL: 173.1
Triglycerides: 87 mg/dL (ref 0.0–149.0)
VLDL: 17.4 mg/dL (ref 0.0–40.0)

## 2015-06-18 LAB — TSH: TSH: 2.25 u[IU]/mL (ref 0.35–4.50)

## 2015-06-18 LAB — CBC WITH DIFFERENTIAL/PLATELET
BASOS ABS: 0 10*3/uL (ref 0.0–0.1)
Basophils Relative: 0.8 % (ref 0.0–3.0)
EOS ABS: 0.1 10*3/uL (ref 0.0–0.7)
Eosinophils Relative: 2.8 % (ref 0.0–5.0)
HCT: 43 % (ref 36.0–46.0)
HEMOGLOBIN: 15.1 g/dL — AB (ref 12.0–15.0)
LYMPHS ABS: 2 10*3/uL (ref 0.7–4.0)
LYMPHS PCT: 37.2 % (ref 12.0–46.0)
MCHC: 35 g/dL (ref 30.0–36.0)
MCV: 98.9 fl (ref 78.0–100.0)
Monocytes Absolute: 0.4 10*3/uL (ref 0.1–1.0)
Monocytes Relative: 7.5 % (ref 3.0–12.0)
NEUTROS ABS: 2.8 10*3/uL (ref 1.4–7.7)
NEUTROS PCT: 51.7 % (ref 43.0–77.0)
Platelets: 255 10*3/uL (ref 150.0–400.0)
RBC: 4.35 Mil/uL (ref 3.87–5.11)
RDW: 12.1 % (ref 11.5–15.5)
WBC: 5.3 10*3/uL (ref 4.0–10.5)

## 2015-06-18 LAB — HEPATIC FUNCTION PANEL
ALT: 22 U/L (ref 0–35)
AST: 19 U/L (ref 0–37)
Albumin: 4.1 g/dL (ref 3.5–5.2)
Alkaline Phosphatase: 59 U/L (ref 39–117)
Bilirubin, Direct: 0.1 mg/dL (ref 0.0–0.3)
TOTAL PROTEIN: 7.1 g/dL (ref 6.0–8.3)
Total Bilirubin: 0.6 mg/dL (ref 0.2–1.2)

## 2015-06-18 LAB — URINALYSIS, ROUTINE W REFLEX MICROSCOPIC
BILIRUBIN URINE: NEGATIVE
HGB URINE DIPSTICK: NEGATIVE
Ketones, ur: NEGATIVE
LEUKOCYTES UA: NEGATIVE
Nitrite: NEGATIVE
PH: 7 (ref 5.0–8.0)
RBC / HPF: NONE SEEN (ref 0–?)
Specific Gravity, Urine: 1.015 (ref 1.000–1.030)
TOTAL PROTEIN, URINE-UPE24: NEGATIVE
URINE GLUCOSE: NEGATIVE
Urobilinogen, UA: 0.2 (ref 0.0–1.0)
WBC UA: NONE SEEN (ref 0–?)

## 2015-06-18 MED ORDER — SERTRALINE HCL 100 MG PO TABS: 100.0000 mg | ORAL_TABLET | Freq: Every day | ORAL | Status: DC

## 2015-06-18 NOTE — Patient Instructions (Addendum)
It was good to see you today.  We have reviewed your prior records including labs and tests today  Health Maintenance reviewed - will refer now for screening mammography. All other recommended immunizations and age-appropriate screenings are up-to-date.  Test(s) ordered today. Your results will be released to [place] (or called to you) after review, usually within 72hours after test completion. If any changes need to be made, you will be notified at that same time.  Medications reviewed and updated, no changes recommended at this time.  Continue to think about giving up cigarettes! Use nicotine gum, nicotine patches or electronic cigarettes. If you're interested in medication to help you quit, please call  - let me know how I can help!  Please schedule followup in 12 months for annual exam and labs, call sooner if problems.  Health Maintenance Adopting a healthy lifestyle and getting preventive care can go a long way to promote health and wellness. Talk with your health care provider about what schedule of regular examinations is right for you. This is a good chance for you to check in with your provider about disease prevention and staying healthy. In between checkups, there are plenty of things you can do on your own. Experts have done a lot of research about which lifestyle changes and preventive measures are most likely to keep you healthy. Ask your health care provider for more information. WEIGHT AND DIET  Eat a healthy diet  Be sure to include plenty of vegetables, fruits, low-fat dairy products, and lean protein.  Do not eat a lot of foods high in solid fats, added sugars, or salt.  Get regular exercise. This is one of the most important things you can do for your health.  Most adults should exercise for at least 150 minutes each week. The exercise should increase your heart rate and make you sweat (moderate-intensity exercise).  Most adults should also do strengthening exercises  at least twice a week. This is in addition to the moderate-intensity exercise.  Maintain a healthy weight  Body mass index (BMI) is a measurement that can be used to identify possible weight problems. It estimates body fat based on height and weight. Your health care provider can help determine your BMI and help you achieve or maintain a healthy weight.  For females 51 years of age and older:   A BMI below 18.5 is considered underweight.  A BMI of 18.5 to 24.9 is normal.  A BMI of 25 to 29.9 is considered overweight.  A BMI of 30 and above is considered obese.  Watch levels of cholesterol and blood lipids  You should start having your blood tested for lipids and cholesterol at 58 years of age, then have this test every 5 years.  You may need to have your cholesterol levels checked more often if:  Your lipid or cholesterol levels are high.  You are older than 59 years of age.  You are at high risk for heart disease.  CANCER SCREENING   Lung Cancer  Lung cancer screening is recommended for adults 42-43 years old who are at high risk for lung cancer because of a history of smoking.  A yearly low-dose CT scan of the lungs is recommended for people who:  Currently smoke.  Have quit within the past 15 years.  Have at least a 30-pack-year history of smoking. A pack year is smoking an average of one pack of cigarettes a day for 1 year.  Yearly screening should continue until it  has been 15 years since you quit.  Yearly screening should stop if you develop a health problem that would prevent you from having lung cancer treatment.  Breast Cancer  Practice breast self-awareness. This means understanding how your breasts normally appear and feel.  It also means doing regular breast self-exams. Let your health care provider know about any changes, no matter how small.  If you are in your 20s or 30s, you should have a clinical breast exam (CBE) by a health care provider every  1-3 years as part of a regular health exam.  If you are 78 or older, have a CBE every year. Also consider having a breast X-ray (mammogram) every year.  If you have a family history of breast cancer, talk to your health care provider about genetic screening.  If you are at high risk for breast cancer, talk to your health care provider about having an MRI and a mammogram every year.  Breast cancer gene (BRCA) assessment is recommended for women who have family members with BRCA-related cancers. BRCA-related cancers include:  Breast.  Ovarian.  Tubal.  Peritoneal cancers.  Results of the assessment will determine the need for genetic counseling and BRCA1 and BRCA2 testing. Cervical Cancer Routine pelvic examinations to screen for cervical cancer are no longer recommended for nonpregnant women who are considered low risk for cancer of the pelvic organs (ovaries, uterus, and vagina) and who do not have symptoms. A pelvic examination may be necessary if you have symptoms including those associated with pelvic infections. Ask your health care provider if a screening pelvic exam is right for you.   The Pap test is the screening test for cervical cancer for women who are considered at risk.  If you had a hysterectomy for a problem that was not cancer or a condition that could lead to cancer, then you no longer need Pap tests.  If you are older than 65 years, and you have had normal Pap tests for the past 10 years, you no longer need to have Pap tests.  If you have had past treatment for cervical cancer or a condition that could lead to cancer, you need Pap tests and screening for cancer for at least 20 years after your treatment.  If you no longer get a Pap test, assess your risk factors if they change (such as having a new sexual partner). This can affect whether you should start being screened again.  Some women have medical problems that increase their chance of getting cervical cancer. If  this is the case for you, your health care provider may recommend more frequent screening and Pap tests.  The human papillomavirus (HPV) test is another test that may be used for cervical cancer screening. The HPV test looks for the virus that can cause cell changes in the cervix. The cells collected during the Pap test can be tested for HPV.  The HPV test can be used to screen women 81 years of age and older. Getting tested for HPV can extend the interval between normal Pap tests from three to five years.  An HPV test also should be used to screen women of any age who have unclear Pap test results.  After 58 years of age, women should have HPV testing as often as Pap tests.  Colorectal Cancer  This type of cancer can be detected and often prevented.  Routine colorectal cancer screening usually begins at 58 years of age and continues through 58 years of age.  Your health care provider may recommend screening at an earlier age if you have risk factors for colon cancer.  Your health care provider may also recommend using home test kits to check for hidden blood in the stool.  A small camera at the end of a tube can be used to examine your colon directly (sigmoidoscopy or colonoscopy). This is done to check for the earliest forms of colorectal cancer.  Routine screening usually begins at age 57.  Direct examination of the colon should be repeated every 5-10 years through 58 years of age. However, you may need to be screened more often if early forms of precancerous polyps or small growths are found. Skin Cancer  Check your skin from head to toe regularly.  Tell your health care provider about any new moles or changes in moles, especially if there is a change in a mole's shape or color.  Also tell your health care provider if you have a mole that is larger than the size of a pencil eraser.  Always use sunscreen. Apply sunscreen liberally and repeatedly throughout the day.  Protect  yourself by wearing long sleeves, pants, a wide-brimmed hat, and sunglasses whenever you are outside. HEART DISEASE, DIABETES, AND HIGH BLOOD PRESSURE   Have your blood pressure checked at least every 1-2 years. High blood pressure causes heart disease and increases the risk of stroke.  If you are between 49 years and 58 years old, ask your health care provider if you should take aspirin to prevent strokes.  Have regular diabetes screenings. This involves taking a blood sample to check your fasting blood sugar level.  If you are at a normal weight and have a low risk for diabetes, have this test once every three years after 58 years of age.  If you are overweight and have a high risk for diabetes, consider being tested at a younger age or more often. PREVENTING INFECTION  Hepatitis B  If you have a higher risk for hepatitis B, you should be screened for this virus. You are considered at high risk for hepatitis B if:  You were born in a country where hepatitis B is common. Ask your health care provider which countries are considered high risk.  Your parents were born in a high-risk country, and you have not been immunized against hepatitis B (hepatitis B vaccine).  You have HIV or AIDS.  You use needles to inject street drugs.  You live with someone who has hepatitis B.  You have had sex with someone who has hepatitis B.  You get hemodialysis treatment.  You take certain medicines for conditions, including cancer, organ transplantation, and autoimmune conditions. Hepatitis C  Blood testing is recommended for:  Everyone born from [birth year] through 1964.  Anyone with known risk factors for hepatitis C. Sexually transmitted infections (STIs)  You should be screened for sexually transmitted infections (STIs) including gonorrhea and chlamydia if:  You are sexually active and are younger than 58 years of age.  You are older than 58 years of age and your health care provider tells you  that you are at risk for this type of infection.  Your sexual activity has changed since you were last screened and you are at an increased risk for chlamydia or gonorrhea. Ask your health care provider if you are at risk.  If you do not have HIV, but are at risk, it may be recommended that you take a prescription medicine daily to prevent HIV infection. This is  called pre-exposure prophylaxis (PrEP). You are considered at risk if:  You are sexually active and do not regularly use condoms or know the HIV status of your partner(s).  You take drugs by injection.  You are sexually active with a partner who has HIV. Talk with your health care provider about whether you are at high risk of being infected with HIV. If you choose to begin PrEP, you should first be tested for HIV. You should then be tested every 3 months for as long as you are taking PrEP.  PREGNANCY   If you are premenopausal and you may become pregnant, ask your health care provider about preconception counseling.  If you may become pregnant, take 400 to 800 micrograms (mcg) of folic acid every day.  If you want to prevent pregnancy, talk to your health care provider about birth control (contraception). OSTEOPOROSIS AND MENOPAUSE   Osteoporosis is a disease in which the bones lose minerals and strength with aging. This can result in serious bone fractures. Your risk for osteoporosis can be identified using a bone density scan.  If you are 77 years of age or older, or if you are at risk for osteoporosis and fractures, ask your health care provider if you should be screened.  Ask your health care provider whether you should take a calcium or vitamin D supplement to lower your risk for osteoporosis.  Menopause may have certain physical symptoms and risks.  Hormone replacement therapy may reduce some of these symptoms and risks. Talk to your health care provider about whether hormone replacement therapy is right for you.  HOME  CARE INSTRUCTIONS   Schedule regular health, dental, and eye exams.  Stay current with your immunizations.   Do not use any tobacco products including cigarettes, chewing tobacco, or electronic cigarettes.  If you are pregnant, do not drink alcohol.  If you are breastfeeding, limit how much and how often you drink alcohol.  Limit alcohol intake to no more than 1 drink per day for nonpregnant women. One drink equals 12 ounces of beer, 5 ounces of wine, or 1 ounces of hard liquor.  Do not use street drugs.  Do not share needles.  Ask your health care provider for help if you need support or information about quitting drugs.  Tell your health care provider if you often feel depressed.  Tell your health care provider if you have ever been abused or do not feel safe at home. Document Released: 05/24/2011 Document Revised: 03/25/2014 Document Reviewed: 10/10/2013 [facility] Patient Information 2015 [place], [place]. This information is not intended to replace advice given to you by your health care provider. Make sure you discuss any questions you have with your health care provider.

## 2015-06-18 NOTE — Progress Notes (Signed)
Pre visit review using our clinic review tool, if applicable. No additional management support is needed unless otherwise documented below in the visit note. 

## 2015-06-18 NOTE — Progress Notes (Signed)
Subjective:    Patient ID: Teresa Monroe, female    DOB: [birth year], 59 y.o.   MRN: [id number]  HPI  Patient here for annual exam. Last visit October 2014. Generally feels well Reviewed chronic medical conditions, interval events and denies current concerns  Past Medical History  Diagnosis Date  . VITAMIN B12 DEFICIENCY dx 08/2009    self administering monthly IM replacement  . DEPRESSION     controlled with meds  . GERD   . IBS (irritable bowel syndrome)   . ALLERGIC RHINITIS   . Dyslipidemia     Dx CPX 09/2013 - lifestyle changes recommended   Family History  Problem Relation Age of Onset  . Lung cancer Father 30  . Colon cancer Brother 49    stage 4, cured with resection, XRT   History  Substance Use Topics  . Smoking status: Current Every Day Smoker -- 1.00 packs/day  . Smokeless tobacco: Not on file  . Alcohol Use: 0.0 oz/week    0 Standard drinks or equivalent per week     Comment: occassional wine    Review of Systems  Constitutional: Negative for fatigue and unexpected weight change.  Respiratory: Negative for cough, shortness of breath and wheezing.   Cardiovascular: Negative for chest pain, palpitations and leg swelling.  Gastrointestinal: Negative for nausea, abdominal pain and diarrhea.  Neurological: Negative for dizziness, weakness, light-headedness and headaches.  Psychiatric/Behavioral: Negative for dysphoric mood. The patient is not nervous/anxious.   All other systems reviewed and are negative.      Objective:    Physical Exam  Constitutional: She is oriented to person, place, and time. She appears well-developed and well-nourished. No distress.  Overweight  HENT:  Head: Normocephalic and atraumatic.  Right Ear: External ear normal.  Left Ear: External ear normal.  Nose: Nose normal.  Mouth/Throat: Oropharynx is clear and moist. No oropharyngeal exudate.  Eyes: EOM are normal. Pupils are equal, round, and reactive to light. Right eye  exhibits no discharge. Left eye exhibits no discharge. No scleral icterus.  Neck: Normal range of motion. Neck supple. No JVD present. No tracheal deviation present. No thyromegaly present.  Cardiovascular: Normal rate, regular rhythm, normal heart sounds and intact distal pulses.  Exam reveals no friction rub.   No murmur heard. Pulmonary/Chest: Effort normal and breath sounds normal. No respiratory distress. She has no wheezes. She has no rales. She exhibits no tenderness.  Abdominal: Soft. Bowel sounds are normal. She exhibits no distension and no mass. There is no tenderness. There is no rebound and no guarding.  Genitourinary:  Defer to gyn  Musculoskeletal: Normal range of motion.  No gross deformities  Lymphadenopathy:    She has no cervical adenopathy.  Neurological: She is alert and oriented to person, place, and time. She has normal reflexes. No cranial nerve deficit.  Skin: Skin is warm and dry. No rash noted. She is not diaphoretic. No erythema.  Psychiatric: She has a normal mood and affect. Her behavior is normal. Judgment and thought content normal.  Nursing note and vitals reviewed.   BP 112/78 mmHg  Pulse 79  Temp(Src) 97.7 F (36.5 C) (Oral)  Ht  (1.702 m)  Wt 173 lb 8 oz (78.699 kg)  BMI 27.17 kg/m2  SpO2 98% Wt Readings from Last 3 Encounters:  06/18/15 173 lb 8 oz (78.699 kg)  01/16/15 179 lb 12 oz (81.534 kg)  09/17/13 179 lb 6.4 oz (81.375 kg)     Lab Results  Component  Value Date   WBC 5.1 09/24/2013   HGB 14.6 09/24/2013   HCT 41.8 09/24/2013   PLT 293.0 09/24/2013   GLUCOSE 75 09/24/2013   CHOL 255* 09/24/2013   TRIG 130.0 09/24/2013   HDL 69.70 09/24/2013   LDLDIRECT 174.0 09/24/2013   ALT 23 09/24/2013   AST 23 09/24/2013   NA 138 09/24/2013   K 4.3 09/24/2013   CL 103 09/24/2013   CREATININE 0.8 09/24/2013   BUN 13 09/24/2013   CO2 29 09/24/2013   TSH 1.46 09/24/2013    Dg Foot 2 Views Left  07/14/2011   *RADIOLOGY REPORT*   Clinical Data: 59 year old female with foot pain and swelling times 4 days over the dorsal surface.  LEFT FOOT - 2 VIEW  Comparison: None.  Findings: Three cortical screws traverse the left fifth metatarsal. There is ankylosis of the shaft of the left fifth and fourth metatarsals.  There is lucency about the most distal screw.  No other osteolysis.  Joint spaces elsewhere are within normal limits. Bone mineralization elsewhere is normal.  Calcaneus is intact.  No subcutaneous gas.  No acute fracture.  IMPRESSION: Chronic and postoperative changes to the left fourth and fifth metatarsals.  There might be loosening of the distal most fifth metatarsal screw, however, no suspicious or aggressive features identified.  Original Report Authenticated By: Harley Hallmark, M.D.      Assessment & Plan:   CPX/z00.00 - Patient has been counseled on age-appropriate routine health concerns for screening and prevention. These are reviewed and up-to-date. Immunizations are up-to-date or declined. Labs ordered and reviewed.  Problem List Items Addressed This Visit    None    Visit Diagnoses    Routine general medical examination at a health care facility    -  Primary    Relevant Orders    Basic metabolic panel    CBC with Differential/Platelet    Hepatic function panel    Lipid panel    TSH    Urinalysis, Routine w reflex microscopic (not at [facility])    Screening for breast cancer        Relevant Orders    MM DIGITAL SCREENING BILATERAL    Smoker          5 minutes today spent counseling patient on unhealthy effects of continued tobacco abuse and encouragement of cessation including medical options available to help the patient quit smoking.  Rene Paci, MD

## 2015-12-25 ENCOUNTER — Telehealth: Payer: Self-pay | Admitting: Internal Medicine

## 2015-12-25 MED ORDER — SERTRALINE HCL 100 MG PO TABS: 100.0000 mg | ORAL_TABLET | Freq: Every day | ORAL | Status: DC

## 2015-12-25 NOTE — Telephone Encounter (Signed)
Sent in, thanks.  

## 2015-12-25 NOTE — Telephone Encounter (Signed)
Pt informed

## 2015-12-25 NOTE — Telephone Encounter (Signed)
I have pt scheduled for her physical in July and she is needing a new prescription for sertraline (ZOLOFT) 100 MG tablet [[id number]] sent to [facility]

## 2016-01-07 ENCOUNTER — Encounter: Payer: Self-pay | Admitting: Internal Medicine

## 2016-02-17 ENCOUNTER — Encounter: Payer: Self-pay | Admitting: Internal Medicine

## 2016-02-18 NOTE — Telephone Encounter (Signed)
Without knowing her insurance I cannot be certain but generally people do not need referral for behavioral health. She is free to see anyone she wants including private practices not at behavioral health.

## 2016-02-19 ENCOUNTER — Other Ambulatory Visit: Payer: Self-pay | Admitting: Internal Medicine

## 2016-04-19 ENCOUNTER — Other Ambulatory Visit: Payer: Self-pay | Admitting: Family

## 2016-06-18 ENCOUNTER — Encounter: Payer: Commercial Managed Care - PPO | Admitting: Internal Medicine

## 2016-06-22 ENCOUNTER — Other Ambulatory Visit (INDEPENDENT_AMBULATORY_CARE_PROVIDER_SITE_OTHER): Payer: Commercial Managed Care - PPO

## 2016-06-22 ENCOUNTER — Ambulatory Visit (INDEPENDENT_AMBULATORY_CARE_PROVIDER_SITE_OTHER): Payer: Commercial Managed Care - PPO | Admitting: Internal Medicine

## 2016-06-22 ENCOUNTER — Encounter: Payer: Self-pay | Admitting: Internal Medicine

## 2016-06-22 VITALS — BP 126/86 | HR 84 | Temp 98.3°F | Resp 14 | Ht 67.0 in | Wt 160.0 lb

## 2016-06-22 DIAGNOSIS — F418 Other specified anxiety disorders: Secondary | ICD-10-CM

## 2016-06-22 DIAGNOSIS — Z0000 Encounter for general adult medical examination without abnormal findings: Secondary | ICD-10-CM

## 2016-06-22 DIAGNOSIS — Z1159 Encounter for screening for other viral diseases: Secondary | ICD-10-CM

## 2016-06-22 DIAGNOSIS — F17200 Nicotine dependence, unspecified, uncomplicated: Secondary | ICD-10-CM | POA: Diagnosis not present

## 2016-06-22 DIAGNOSIS — F329 Major depressive disorder, single episode, unspecified: Secondary | ICD-10-CM

## 2016-06-22 DIAGNOSIS — F32A Depression, unspecified: Secondary | ICD-10-CM

## 2016-06-22 DIAGNOSIS — E538 Deficiency of other specified B group vitamins: Secondary | ICD-10-CM | POA: Diagnosis not present

## 2016-06-22 DIAGNOSIS — F419 Anxiety disorder, unspecified: Secondary | ICD-10-CM

## 2016-06-22 LAB — LIPID PANEL
CHOL/HDL RATIO: 3
CHOLESTEROL: 263 mg/dL — AB (ref 0–200)
HDL: 93.1 mg/dL (ref >39.00)
LDL Cholesterol: 154 mg/dL — ABNORMAL HIGH (ref 0–99)
NonHDL: 170.16
TRIGLYCERIDES: 82 mg/dL (ref 0.0–149.0)
VLDL: 16.4 mg/dL (ref 0.0–40.0)

## 2016-06-22 LAB — CBC
HCT: 43.5 % (ref 36.0–46.0)
Hemoglobin: 15.2 g/dL — ABNORMAL HIGH (ref 12.0–15.0)
MCHC: 34.8 g/dL (ref 30.0–36.0)
MCV: 100.9 fl — AB (ref 78.0–100.0)
Platelets: 261 10*3/uL (ref 150.0–400.0)
RBC: 4.32 Mil/uL (ref 3.87–5.11)
RDW: 12.6 % (ref 11.5–15.5)
WBC: 6.5 10*3/uL (ref 4.0–10.5)

## 2016-06-22 LAB — COMPREHENSIVE METABOLIC PANEL
ALBUMIN: 4.2 g/dL (ref 3.5–5.2)
ALT: 46 U/L — AB (ref 0–35)
AST: 39 U/L — AB (ref 0–37)
Alkaline Phosphatase: 70 U/L (ref 39–117)
BILIRUBIN TOTAL: 0.8 mg/dL (ref 0.2–1.2)
BUN: 10 mg/dL (ref 6–23)
CALCIUM: 9.5 mg/dL (ref 8.4–10.5)
CO2: 26 meq/L (ref 19–32)
CREATININE: 0.87 mg/dL (ref 0.40–1.20)
Chloride: 105 mEq/L (ref 96–112)
GFR: 70.82 mL/min (ref >60.00)
Glucose, Bld: 82 mg/dL (ref 70–99)
Potassium: 4.7 mEq/L (ref 3.5–5.1)
Sodium: 140 mEq/L (ref 135–145)
TOTAL PROTEIN: 7.4 g/dL (ref 6.0–8.3)

## 2016-06-22 LAB — HEPATITIS C ANTIBODY: HCV Ab: NEGATIVE

## 2016-06-22 LAB — VITAMIN B12: VITAMIN B 12: 288 pg/mL (ref 211–911)

## 2016-06-22 LAB — TSH: TSH: 1.08 u[IU]/mL (ref 0.35–4.50)

## 2016-06-22 NOTE — Progress Notes (Signed)
Pre visit review using our clinic review tool, if applicable. No additional management support is needed unless otherwise documented below in the visit note. 

## 2016-06-22 NOTE — Assessment & Plan Note (Signed)
[facility] and they are [facility] her wellbutrin which was recently changed from zoloft.

## 2016-06-22 NOTE — Patient Instructions (Signed)
We will check the labs today and send the results on mychart.   If the B12 level is low we will send back in the shots to do at home.   Work on stopping smoking as this is your biggest health risk.   Smoking Cessation, Tips for Success If you are ready to quit smoking, congratulations! You have chosen to help yourself be healthier. Cigarettes bring nicotine, tar, carbon monoxide, and other irritants into your body. Your lungs, heart, and blood vessels will be able to work better without these poisons. There are many different ways to quit smoking. Nicotine gum, nicotine patches, a nicotine inhaler, or nicotine nasal spray can help with physical craving. Hypnosis, support groups, and medicines help break the habit of smoking. WHAT THINGS CAN I DO TO MAKE QUITTING EASIER?  Here are some tips to help you quit for good:  Pick a date when you will quit smoking completely. Tell all of your friends and family about your plan to quit on that date.  Do not try to slowly cut down on the number of cigarettes you are smoking. Pick a quit date and quit smoking completely starting on that day.  Throw away all cigarettes.   Clean and remove all ashtrays from your home, work, and car.  On a card, write down your reasons for quitting. Carry the card with you and read it when you get the urge to smoke.  Cleanse your body of nicotine. Drink enough water and fluids to keep your urine clear or pale yellow. Do this after quitting to flush the nicotine from your body.  Learn to predict your moods. Do not let a bad situation be your excuse to have a cigarette. Some situations in your life might tempt you into wanting a cigarette.  Never have "just one" cigarette. It leads to wanting another and another. Remind yourself of your decision to quit.  Change habits associated with smoking. If you smoked while driving or when feeling stressed, try other activities to replace smoking. Stand up when drinking your coffee.  Brush your teeth after eating. Sit in a different chair when you read the paper. Avoid alcohol while trying to quit, and try to drink fewer caffeinated beverages. Alcohol and caffeine may urge you to smoke.  Avoid foods and drinks that can trigger a desire to smoke, such as sugary or spicy foods and alcohol.  Ask people who smoke not to smoke around you.  Have something planned to do right after eating or having a cup of coffee. For example, plan to take a walk or exercise.  Try a relaxation exercise to calm you down and decrease your stress. Remember, you may be tense and nervous for the first 2 weeks after you quit, but this will pass.  Find new activities to keep your hands busy. Play with a pen, coin, or rubber band. Doodle or draw things on paper.  Brush your teeth right after eating. This will help cut down on the craving for the taste of tobacco after meals. You can also try mouthwash.   Use oral substitutes in place of cigarettes. Try using lemon drops, carrots, cinnamon sticks, or chewing gum. Keep them handy so they are available when you have the urge to smoke.  When you have the urge to smoke, try deep breathing.  Designate your home as a nonsmoking area.  If you are a heavy smoker, ask your health care provider about a prescription for nicotine chewing gum. It can ease  your withdrawal from nicotine.  Reward yourself. Set aside the cigarette money you save and buy yourself something nice.  Look for support from others. Join a support group or smoking cessation program. Ask someone at home or at work to help you with your plan to quit smoking.  Always ask yourself, "Do I need this cigarette or is this just a reflex?" Tell yourself, "Today, I choose not to smoke," or "I do not want to smoke." You are reminding yourself of your decision to quit.  Do not replace cigarette smoking with electronic cigarettes (commonly called e-cigarettes). The safety of e-cigarettes is unknown, and  some may contain harmful chemicals.  If you relapse, do not give up! Plan ahead and think about what you will do the next time you get the urge to smoke. HOW WILL I FEEL WHEN I QUIT SMOKING? You may have symptoms of withdrawal because your body is used to nicotine (the addictive substance in cigarettes). You may crave cigarettes, be irritable, feel very hungry, cough often, get headaches, or have difficulty concentrating. The withdrawal symptoms are only temporary. They are strongest when you first quit but will go away within 10-14 days. When withdrawal symptoms occur, stay in control. Think about your reasons for quitting. Remind yourself that these are signs that your body is healing and getting used to being without cigarettes. Remember that withdrawal symptoms are easier to treat than the major diseases that smoking can cause.  Even after the withdrawal is over, expect periodic urges to smoke. However, these cravings are generally short lived and will go away whether you smoke or not. Do not smoke! WHAT RESOURCES ARE AVAILABLE TO HELP ME QUIT SMOKING? Your health care provider can direct you to community resources or hospitals for support, which may include:  Group support.  Education.  Hypnosis.  Therapy.   This information is not intended to replace advice given to you by your health care provider. Make sure you discuss any questions you have with your health care provider.   Document Released: 08/06/2004 Document Revised: 11/29/2014 Document Reviewed: 04/26/2013 Elsevier Interactive Patient Education 2016 [place] Maintenance, Female Adopting a healthy lifestyle and getting preventive care can go a long way to promote health and wellness. Talk with your health care provider about what schedule of regular examinations is right for you. This is a good chance for you to check in with your provider about disease prevention and staying healthy. In between checkups, there are  plenty of things you can do on your own. Experts have done a lot of research about which lifestyle changes and preventive measures are most likely to keep you healthy. Ask your health care provider for more information. WEIGHT AND DIET  Eat a healthy diet  Be sure to include plenty of vegetables, fruits, low-fat dairy products, and lean protein.  Do not eat a lot of foods high in solid fats, added sugars, or salt.  Get regular exercise. This is one of the most important things you can do for your health.  Most adults should exercise for at least 150 minutes each week. The exercise should increase your heart rate and make you sweat (moderate-intensity exercise).  Most adults should also do strengthening exercises at least twice a week. This is in addition to the moderate-intensity exercise.  Maintain a healthy weight  Body mass index (BMI) is a measurement that can be used to identify possible weight problems. It estimates body fat based on height and weight. Your  health care provider can help determine your BMI and help you achieve or maintain a healthy weight.  For females 17 years of age and older:   A BMI below 18.5 is considered underweight.  A BMI of 18.5 to 24.9 is normal.  A BMI of 25 to 29.9 is considered overweight.  A BMI of 30 and above is considered obese.  Watch levels of cholesterol and blood lipids  You should start having your blood tested for lipids and cholesterol at 59 years of age, then have this test every 5 years.  You may need to have your cholesterol levels checked more often if:  Your lipid or cholesterol levels are high.  You are older than 59 years of age.  You are at high risk for heart disease.  CANCER SCREENING   Lung Cancer  Lung cancer screening is recommended for adults 69-14 years old who are at high risk for lung cancer because of a history of smoking.  A yearly low-dose CT scan of the lungs is recommended for people who:  Currently  smoke.  Have quit within the past 15 years.  Have at least a 30-pack-year history of smoking. A pack year is smoking an average of one pack of cigarettes a day for 1 year.  Yearly screening should continue until it has been 15 years since you quit.  Yearly screening should stop if you develop a health problem that would prevent you from having lung cancer treatment.  Breast Cancer  Practice breast self-awareness. This means understanding how your breasts normally appear and feel.  It also means doing regular breast self-exams. Let your health care provider know about any changes, no matter how small.  If you are in your 20s or 30s, you should have a clinical breast exam (CBE) by a health care provider every 1-3 years as part of a regular health exam.  If you are 67 or older, have a CBE every year. Also consider having a breast X-ray (mammogram) every year.  If you have a family history of breast cancer, talk to your health care provider about genetic screening.  If you are at high risk for breast cancer, talk to your health care provider about having an MRI and a mammogram every year.  Breast cancer gene (BRCA) assessment is recommended for women who have family members with BRCA-related cancers. BRCA-related cancers include:  Breast.  Ovarian.  Tubal.  Peritoneal cancers.  Results of the assessment will determine the need for genetic counseling and BRCA1 and BRCA2 testing. Cervical Cancer Your health care provider may recommend that you be screened regularly for cancer of the pelvic organs (ovaries, uterus, and vagina). This screening involves a pelvic examination, including checking for microscopic changes to the surface of your cervix (Pap test). You may be encouraged to have this screening done every 3 years, beginning at age 71.  For women ages 14-65, health care providers may recommend pelvic exams and Pap testing every 3 years, or they may recommend the Pap and pelvic  exam, combined with testing for human papilloma virus (HPV), every 5 years. Some types of HPV increase your risk of cervical cancer. Testing for HPV may also be done on women of any age with unclear Pap test results.  Other health care providers may not recommend any screening for nonpregnant women who are considered low risk for pelvic cancer and who do not have symptoms. Ask your health care provider if a screening pelvic exam is right for you.  If you have had past treatment for cervical cancer or a condition that could lead to cancer, you need Pap tests and screening for cancer for at least 20 years after your treatment. If Pap tests have been discontinued, your risk factors (such as having a new sexual partner) need to be reassessed to determine if screening should resume. Some women have medical problems that increase the chance of getting cervical cancer. In these cases, your health care provider may recommend more frequent screening and Pap tests. Colorectal Cancer  This type of cancer can be detected and often prevented.  Routine colorectal cancer screening usually begins at 59 years of age and continues through 59 years of age.  Your health care provider may recommend screening at an earlier age if you have risk factors for colon cancer.  Your health care provider may also recommend using home test kits to check for hidden blood in the stool.  A small camera at the end of a tube can be used to examine your colon directly (sigmoidoscopy or colonoscopy). This is done to check for the earliest forms of colorectal cancer.  Routine screening usually begins at age 52.  Direct examination of the colon should be repeated every 5-10 years through 59 years of age. However, you may need to be screened more often if early forms of precancerous polyps or small growths are found. Skin Cancer  Check your skin from head to toe regularly.  Tell your health care provider about any new moles or  changes in moles, especially if there is a change in a mole's shape or color.  Also tell your health care provider if you have a mole that is larger than the size of a pencil eraser.  Always use sunscreen. Apply sunscreen liberally and repeatedly throughout the day.  Protect yourself by wearing long sleeves, pants, a wide-brimmed hat, and sunglasses whenever you are outside. HEART DISEASE, DIABETES, AND HIGH BLOOD PRESSURE   High blood pressure causes heart disease and increases the risk of stroke. High blood pressure is more likely to develop in:  People who have blood pressure in the high end of the normal range (130-139/85-89 mm Hg).  People who are overweight or obese.  People who are African American.  If you are 89-91 years of age, have your blood pressure checked every 3-5 years. If you are 11 years of age or older, have your blood pressure checked every year. You should have your blood pressure measured twice--once when you are at a hospital or clinic, and once when you are not at a hospital or clinic. Record the average of the two measurements. To check your blood pressure when you are not at a hospital or clinic, you can use:  An automated blood pressure machine at a pharmacy.  A home blood pressure monitor.  If you are between 83 years and 33 years old, ask your health care provider if you should take aspirin to prevent strokes.  Have regular diabetes screenings. This involves taking a blood sample to check your fasting blood sugar level.  If you are at a normal weight and have a low risk for diabetes, have this test once every three years after 59 years of age.  If you are overweight and have a high risk for diabetes, consider being tested at a younger age or more often. PREVENTING INFECTION  Hepatitis B  If you have a higher risk for hepatitis B, you should be screened for this virus. You are considered  at high risk for hepatitis B if:  You were born in a country where  hepatitis B is common. Ask your health care provider which countries are considered high risk.  Your parents were born in a high-risk country, and you have not been immunized against hepatitis B (hepatitis B vaccine).  You have HIV or AIDS.  You use needles to inject street drugs.  You live with someone who has hepatitis B.  You have had sex with someone who has hepatitis B.  You get hemodialysis treatment.  You take certain medicines for conditions, including cancer, organ transplantation, and autoimmune conditions. Hepatitis C  Blood testing is recommended for:  Everyone born from [birth year] through 1964.  Anyone with known risk factors for hepatitis C. Sexually transmitted infections (STIs)  You should be screened for sexually transmitted infections (STIs) including gonorrhea and chlamydia if:  You are sexually active and are younger than 59 years of age.  You are older than 59 years of age and your health care provider tells you that you are at risk for this type of infection.  Your sexual activity has changed since you were last screened and you are at an increased risk for chlamydia or gonorrhea. Ask your health care provider if you are at risk.  If you do not have HIV, but are at risk, it may be recommended that you take a prescription medicine daily to prevent HIV infection. This is called pre-exposure prophylaxis (PrEP). You are considered at risk if:  You are sexually active and do not regularly use condoms or know the HIV status of your partner(s).  You take drugs by injection.  You are sexually active with a partner who has HIV. Talk with your health care provider about whether you are at high risk of being infected with HIV. If you choose to begin PrEP, you should first be tested for HIV. You should then be tested every 3 months for as long as you are taking PrEP.  PREGNANCY   If you are premenopausal and you may become pregnant, ask your health care provider about  preconception counseling.  If you may become pregnant, take 400 to 800 micrograms (mcg) of folic acid every day.  If you want to prevent pregnancy, talk to your health care provider about birth control (contraception). OSTEOPOROSIS AND MENOPAUSE   Osteoporosis is a disease in which the bones lose minerals and strength with aging. This can result in serious bone fractures. Your risk for osteoporosis can be identified using a bone density scan.  If you are 52 years of age or older, or if you are at risk for osteoporosis and fractures, ask your health care provider if you should be screened.  Ask your health care provider whether you should take a calcium or vitamin D supplement to lower your risk for osteoporosis.  Menopause may have certain physical symptoms and risks.  Hormone replacement therapy may reduce some of these symptoms and risks. Talk to your health care provider about whether hormone replacement therapy is right for you.  HOME CARE INSTRUCTIONS   Schedule regular health, dental, and eye exams.  Stay current with your immunizations.   Do not use any tobacco products including cigarettes, chewing tobacco, or electronic cigarettes.  If you are pregnant, do not drink alcohol.  If you are breastfeeding, limit how much and how often you drink alcohol.  Limit alcohol intake to no more than 1 drink per day for nonpregnant women. One drink equals 12 ounces of  beer, 5 ounces of wine, or 1 ounces of hard liquor.  Do not use street drugs.  Do not share needles.  Ask your health care provider for help if you need support or information about quitting drugs.  Tell your health care provider if you often feel depressed.  Tell your health care provider if you have ever been abused or do not feel safe at home.   This information is not intended to replace advice given to you by your health care provider. Make sure you discuss any questions you have with your health care  provider.   Document Released: 05/24/2011 Document Revised: 11/29/2014 Document Reviewed: 10/10/2013 Elsevier Interactive Patient Education [employer].

## 2016-06-22 NOTE — Assessment & Plan Note (Signed)
Checking B12 level, out of shots for some months now.

## 2016-06-22 NOTE — Assessment & Plan Note (Signed)
She will work on stopping and on wellbutrin now for mood which may help as well. Reminded her this is her biggest health problem and the risks and harms.

## 2016-06-22 NOTE — Assessment & Plan Note (Signed)
Checking labs today, she will get pap and mammogram with gyn soon. Reminded about smoking cessation and dangers of distracted driving. Counseled about sun safety and mole surveillance. Given screening recommendations.

## 2016-06-22 NOTE — Progress Notes (Signed)
   Subjective:    Patient ID: Teresa Monroe, female    DOB: 06-17-1956, 60 y.o.   MRN: [id number]  HPI The patient is a 60 YO female coming in for transition to new provider since her provider has left. She is requesting wellness today. No new concerns.   PMH, [facility], social history reviewed and updated.   Review of Systems  Constitutional: Negative for activity change, appetite change, chills and unexpected weight change.  HENT: Negative.   Eyes: Negative.   Respiratory: Negative for cough, chest tightness and shortness of breath.   Cardiovascular: Negative for chest pain, palpitations and leg swelling.  Gastrointestinal: Negative for abdominal distention, abdominal pain, constipation, diarrhea and nausea.  Musculoskeletal: Negative.   Skin: Negative.   Neurological: Negative.   Psychiatric/Behavioral: Negative.       Objective:   Physical Exam  Constitutional: She is oriented to person, place, and time. She appears well-developed and well-nourished.  HENT:  Head: Normocephalic and atraumatic.  Eyes: EOM are normal.  Neck: Normal range of motion.  Cardiovascular: Normal rate and regular rhythm.   Pulmonary/Chest: Effort normal and breath sounds normal. No respiratory distress. She has no wheezes. She has no rales.  Abdominal: Soft. Bowel sounds are normal. She exhibits no distension. There is no tenderness. There is no rebound.  Musculoskeletal: She exhibits no edema.  Neurological: She is alert and oriented to person, place, and time. Coordination normal.  Skin: Skin is warm and dry.  Psychiatric: She has a normal mood and affect.   Vitals:   06/22/16 0836  BP: 126/86  Pulse: 84  Resp: 14  Temp: 98.3 F (36.8 C)  TempSrc: Oral  SpO2: 98%  Weight: 160 lb (72.6 kg)  Height: 5\' 7"  (1.702 m)      Assessment & Plan:

## 2016-06-23 LAB — HIV ANTIBODY (ROUTINE TESTING W REFLEX): HIV: NONREACTIVE

## 2016-12-21 ENCOUNTER — Encounter: Payer: Self-pay | Admitting: Internal Medicine

## 2016-12-21 ENCOUNTER — Ambulatory Visit (INDEPENDENT_AMBULATORY_CARE_PROVIDER_SITE_OTHER): Payer: Commercial Managed Care - PPO | Admitting: Internal Medicine

## 2016-12-21 DIAGNOSIS — H66001 Acute suppurative otitis media without spontaneous rupture of ear drum, right ear: Secondary | ICD-10-CM | POA: Diagnosis not present

## 2016-12-21 DIAGNOSIS — H6691 Otitis media, unspecified, right ear: Secondary | ICD-10-CM | POA: Insufficient documentation

## 2016-12-21 MED ORDER — AMOXICILLIN-POT CLAVULANATE 875-125 MG PO TABS: 1.0000 | ORAL_TABLET | Freq: Two times a day (BID) | ORAL | 0 refills | Status: DC

## 2016-12-21 NOTE — Assessment & Plan Note (Signed)
Rx for augmentin 1 week course. No allergies.

## 2016-12-21 NOTE — Progress Notes (Signed)
   Subjective:    Patient ID: Teresa Monroe, female    DOB: 03/14/1956, 61 y.o.   MRN: [id number]  HPI The patient is a 61 YO female coming in for right ear pain and hearing loss. She is concerned that it is blocked. She has been using the debrox ear drops and trying to clean them out. They are not hurting but hearing is poor. Left ear is normal. No other symptoms or cold or flu. No cough or SOB or chest pains. No fevers or chills.   Review of Systems  Constitutional: Negative for activity change, appetite change, chills, fatigue, fever and unexpected weight change.  HENT: Positive for congestion and ear pain. Negative for dental problem, drooling, ear discharge, facial swelling, postnasal drip, rhinorrhea, sinus pain, sinus pressure, sore throat and trouble swallowing.   Eyes: Negative.   Respiratory: Negative.   Cardiovascular: Negative.   Gastrointestinal: Negative.   Musculoskeletal: Negative.       Objective:   Physical Exam  Constitutional: She appears well-developed and well-nourished.  HENT:  Head: Normocephalic and atraumatic.  Left Ear: External ear normal.  Nose: Nose normal.  Mouth/Throat: Oropharynx is clear and moist.  Right ear with otitis media, TM bulging and cloudy fluid  Eyes: EOM are normal.  Neck: Normal range of motion.  Cardiovascular: Normal rate and regular rhythm.   Pulmonary/Chest: Effort normal.  Abdominal: Soft. She exhibits no distension. There is no tenderness. There is no rebound.  Skin: Skin is warm and dry.   Vitals:   12/21/16 0835  BP: 130/78  Pulse: (!) 104  Resp: 12  Temp: 98.4 F (36.9 C)  TempSrc: Oral  SpO2: 99%  Weight: 150 lb 8 oz (68.3 kg)  Height: 5\' 7"  (1.702 m)      Assessment & Plan:

## 2016-12-21 NOTE — Patient Instructions (Signed)
We have sent in the augmentin for the ear infection. Take 1 pill twice a day for 1 week.

## 2016-12-21 NOTE — Progress Notes (Signed)
Pre visit review using our clinic review tool, if applicable. No additional management support is needed unless otherwise documented below in the visit note. 

## 2017-04-05 ENCOUNTER — Encounter: Payer: Self-pay | Admitting: Internal Medicine

## 2017-08-30 LAB — HM MAMMOGRAPHY

## 2017-08-31 LAB — HM PAP SMEAR

## 2018-06-01 ENCOUNTER — Encounter: Payer: Self-pay | Admitting: Internal Medicine

## 2018-07-10 ENCOUNTER — Other Ambulatory Visit (INDEPENDENT_AMBULATORY_CARE_PROVIDER_SITE_OTHER): Payer: BLUE CROSS/BLUE SHIELD

## 2018-07-10 ENCOUNTER — Encounter: Payer: Self-pay | Admitting: Internal Medicine

## 2018-07-10 ENCOUNTER — Ambulatory Visit (INDEPENDENT_AMBULATORY_CARE_PROVIDER_SITE_OTHER): Payer: BLUE CROSS/BLUE SHIELD | Admitting: Internal Medicine

## 2018-07-10 VITALS — BP 108/70 | HR 76 | Temp 98.0°F | Ht 67.0 in | Wt 143.0 lb

## 2018-07-10 DIAGNOSIS — Z23 Encounter for immunization: Secondary | ICD-10-CM

## 2018-07-10 DIAGNOSIS — Z0000 Encounter for general adult medical examination without abnormal findings: Secondary | ICD-10-CM | POA: Diagnosis not present

## 2018-07-10 DIAGNOSIS — Z87891 Personal history of nicotine dependence: Secondary | ICD-10-CM

## 2018-07-10 DIAGNOSIS — E538 Deficiency of other specified B group vitamins: Secondary | ICD-10-CM | POA: Diagnosis not present

## 2018-07-10 LAB — COMPREHENSIVE METABOLIC PANEL
ALT: 20 U/L (ref 0–35)
AST: 22 U/L (ref 0–37)
Albumin: 4.2 g/dL (ref 3.5–5.2)
Alkaline Phosphatase: 62 U/L (ref 39–117)
BILIRUBIN TOTAL: 0.7 mg/dL (ref 0.2–1.2)
BUN: 12 mg/dL (ref 6–23)
CO2: 26 meq/L (ref 19–32)
Calcium: 9.6 mg/dL (ref 8.4–10.5)
Chloride: 102 mEq/L (ref 96–112)
Creatinine, Ser: 0.88 mg/dL (ref 0.40–1.20)
GFR: 69.41 mL/min (ref >60.00)
GLUCOSE: 80 mg/dL (ref 70–99)
Potassium: 4.1 mEq/L (ref 3.5–5.1)
Sodium: 139 mEq/L (ref 135–145)
Total Protein: 7 g/dL (ref 6.0–8.3)

## 2018-07-10 LAB — LIPID PANEL
Cholesterol: 226 mg/dL — ABNORMAL HIGH (ref 0–200)
HDL: 93.4 mg/dL (ref >39.00)
LDL Cholesterol: 117 mg/dL — ABNORMAL HIGH (ref 0–99)
NONHDL: 133.05
Total CHOL/HDL Ratio: 2
Triglycerides: 78 mg/dL (ref 0.0–149.0)
VLDL: 15.6 mg/dL (ref 0.0–40.0)

## 2018-07-10 LAB — CBC
HCT: 42.1 % (ref 36.0–46.0)
HEMOGLOBIN: 14.5 g/dL (ref 12.0–15.0)
MCHC: 34.3 g/dL (ref 30.0–36.0)
MCV: 102.3 fl — ABNORMAL HIGH (ref 78.0–100.0)
Platelets: 256 10*3/uL (ref 150.0–400.0)
RBC: 4.11 Mil/uL (ref 3.87–5.11)
RDW: 11.6 % (ref 11.5–15.5)
WBC: 7 10*3/uL (ref 4.0–10.5)

## 2018-07-10 LAB — VITAMIN D 25 HYDROXY (VIT D DEFICIENCY, FRACTURES): VITD: 25.83 ng/mL — AB (ref 30.00–100.00)

## 2018-07-10 LAB — VITAMIN B12: VITAMIN B 12: 277 pg/mL (ref 211–911)

## 2018-07-10 NOTE — Addendum Note (Signed)
Addended by: Berton LanGULCH, BRIANA R on: 07/10/2018 10:09 AM   Modules accepted: Orders

## 2018-07-10 NOTE — Assessment & Plan Note (Signed)
Flu shot yearly counseled. Shingrix 1st given today. Tetanus up to date. Colonoscopy up to date. Mammogram up to date with gyn getting records, pap smear up to date with gyn and dexa up to date with gyn. Counseled about sun safety and mole surveillance. Counseled about the dangers of distracted driving. Given 10 year screening recommendations.

## 2018-07-10 NOTE — Assessment & Plan Note (Signed)
Quit about 1 month ago and given congratulation and encouraged for complete lifelong cessation.

## 2018-07-10 NOTE — Assessment & Plan Note (Signed)
Off injections and checking B12 level.

## 2018-07-10 NOTE — Patient Instructions (Signed)

## 2018-07-10 NOTE — Progress Notes (Signed)
   Subjective:    Patient ID: Teresa Monroe, female    DOB: 02/12/1956, 62 y.o.   MRN: [id number]  HPI The patient is a 62 YO female coming in for physical. 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 No new concerns.   [facility], [facility] reviewed and updated.   Review of Systems  Constitutional: Negative.   HENT: Negative.   Eyes: Negative.   Respiratory: Negative for cough, chest tightness and shortness of breath.   Cardiovascular: Negative for chest pain, palpitations and leg swelling.  Gastrointestinal: Negative for abdominal distention, abdominal pain, constipation, diarrhea, nausea and vomiting.  Musculoskeletal: Negative.   Skin: Negative.   Neurological: Negative.   Psychiatric/Behavioral: Negative.       Objective:   Physical Exam  Constitutional: She is oriented to person, place, and time. She appears well-developed and well-nourished.  HENT:  Head: Normocephalic and atraumatic.  Eyes: EOM are normal.  Neck: Normal range of motion.  Cardiovascular: Normal rate and regular rhythm.  Pulmonary/Chest: Effort normal and breath sounds normal. No respiratory distress. She has no wheezes. She has no rales.  Abdominal: Soft. Bowel sounds are normal. She exhibits no distension. There is no tenderness. There is no rebound.  Musculoskeletal: She exhibits no edema.  Neurological: She is alert and oriented to person, place, and time. Coordination normal.  Skin: Skin is warm and dry.  Psychiatric: She has a normal mood and affect.   Vitals:   07/10/18 0910  BP: 108/70  Pulse: 76  Temp: 98 F (36.7 C)  TempSrc: Oral  SpO2: 95%  Weight: 143 lb (64.9 kg)  Height: 5\' 7"  (1.702 m)      Assessment & Plan:  Shingrix IM given at visit

## 2018-09-04 ENCOUNTER — Encounter: Payer: Self-pay | Admitting: Internal Medicine

## 2018-09-04 MED ORDER — BUPROPION HCL ER (XL) 150 MG PO TB24: 150.0000 mg | ORAL_TABLET | Freq: Every day | ORAL | 3 refills | Status: DC

## 2018-09-17 ENCOUNTER — Encounter (HOSPITAL_BASED_OUTPATIENT_CLINIC_OR_DEPARTMENT_OTHER): Payer: Self-pay | Admitting: Emergency Medicine

## 2018-09-17 ENCOUNTER — Emergency Department (HOSPITAL_BASED_OUTPATIENT_CLINIC_OR_DEPARTMENT_OTHER): Payer: BLUE CROSS/BLUE SHIELD

## 2018-09-17 ENCOUNTER — Other Ambulatory Visit: Payer: Self-pay

## 2018-09-17 ENCOUNTER — Observation Stay (HOSPITAL_BASED_OUTPATIENT_CLINIC_OR_DEPARTMENT_OTHER)
Admission: EM | Admit: 2018-09-17 | Discharge: 2018-09-18 | Disposition: A | Discharge status: Home / Self Care | Payer: BLUE CROSS/BLUE SHIELD | Attending: Internal Medicine | Admitting: Internal Medicine

## 2018-09-17 DIAGNOSIS — R079 Chest pain, unspecified: Principal | ICD-10-CM | POA: Diagnosis present

## 2018-09-17 DIAGNOSIS — F329 Major depressive disorder, single episode, unspecified: Secondary | ICD-10-CM | POA: Diagnosis not present

## 2018-09-17 DIAGNOSIS — Z87891 Personal history of nicotine dependence: Secondary | ICD-10-CM | POA: Insufficient documentation

## 2018-09-17 DIAGNOSIS — R030 Elevated blood-pressure reading, without diagnosis of hypertension: Secondary | ICD-10-CM | POA: Insufficient documentation

## 2018-09-17 DIAGNOSIS — R9431 Abnormal electrocardiogram [ECG] [EKG]: Secondary | ICD-10-CM | POA: Diagnosis present

## 2018-09-17 DIAGNOSIS — R072 Precordial pain: Secondary | ICD-10-CM | POA: Diagnosis present

## 2018-09-17 DIAGNOSIS — R000 Tachycardia, unspecified: Secondary | ICD-10-CM | POA: Insufficient documentation

## 2018-09-17 DIAGNOSIS — E538 Deficiency of other specified B group vitamins: Secondary | ICD-10-CM | POA: Diagnosis present

## 2018-09-17 DIAGNOSIS — Q231 Congenital insufficiency of aortic valve: Secondary | ICD-10-CM | POA: Insufficient documentation

## 2018-09-17 DIAGNOSIS — Q2381 Bicuspid aortic valve: Secondary | ICD-10-CM

## 2018-09-17 DIAGNOSIS — F419 Anxiety disorder, unspecified: Secondary | ICD-10-CM | POA: Diagnosis present

## 2018-09-17 DIAGNOSIS — E785 Hyperlipidemia, unspecified: Secondary | ICD-10-CM | POA: Diagnosis not present

## 2018-09-17 DIAGNOSIS — F32A Depression, unspecified: Secondary | ICD-10-CM | POA: Diagnosis present

## 2018-09-17 DIAGNOSIS — Z8249 Family history of ischemic heart disease and other diseases of the circulatory system: Secondary | ICD-10-CM | POA: Diagnosis not present

## 2018-09-17 LAB — CBC
HCT: 42.3 % (ref 36.0–46.0)
Hemoglobin: 14.8 g/dL (ref 12.0–15.0)
MCH: 35.5 pg — AB (ref 26.0–34.0)
MCHC: 35 g/dL (ref 30.0–36.0)
MCV: 101.4 fL — ABNORMAL HIGH (ref 80.0–100.0)
PLATELETS: 241 10*3/uL (ref 150–400)
RBC: 4.17 MIL/uL (ref 3.87–5.11)
RDW: 10.9 % — ABNORMAL LOW (ref 11.5–15.5)
WBC: 4.6 10*3/uL (ref 4.0–10.5)
nRBC: 0 % (ref 0.0–0.2)

## 2018-09-17 LAB — BASIC METABOLIC PANEL
Anion gap: 15 (ref 5–15)
BUN: 10 mg/dL (ref 8–23)
CO2: 22 mmol/L (ref 22–32)
CREATININE: 0.78 mg/dL (ref 0.44–1.00)
Calcium: 9.7 mg/dL (ref 8.9–10.3)
Chloride: 102 mmol/L (ref 98–111)
GFR calc Af Amer: >60 mL/min (ref >60)
GLUCOSE: 104 mg/dL — AB (ref 70–99)
Potassium: 3.9 mmol/L (ref 3.5–5.1)
Sodium: 139 mmol/L (ref 135–145)

## 2018-09-17 LAB — D-DIMER, QUANTITATIVE: D-Dimer, Quant: 0.3 ug/mL-FEU (ref 0.00–0.50)

## 2018-09-17 LAB — TROPONIN I: Troponin I: <0.03 ng/mL (ref <0.03)

## 2018-09-17 LAB — MAGNESIUM: Magnesium: 2.1 mg/dL (ref 1.7–2.4)

## 2018-09-17 MED ORDER — NITROGLYCERIN 0.4 MG SL SUBL
0.4000 mg | SUBLINGUAL_TABLET | SUBLINGUAL | Status: DC | PRN
  Administered 2018-09-17 (×3): 0.4 mg via SUBLINGUAL

## 2018-09-17 MED ORDER — POTASSIUM CHLORIDE CRYS ER 20 MEQ PO TBCR
20.0000 meq | EXTENDED_RELEASE_TABLET | Freq: Once | ORAL | Status: AC
  Administered 2018-09-17: 20 meq via ORAL
  Filled 2018-09-17: qty 1

## 2018-09-17 MED ORDER — NITROGLYCERIN 0.4 MG SL SUBL
SUBLINGUAL_TABLET | SUBLINGUAL | Status: AC
  Administered 2018-09-17: 0.4 mg via SUBLINGUAL
  Filled 2018-09-17: qty 1

## 2018-09-17 MED ORDER — ENOXAPARIN SODIUM 40 MG/0.4ML ~~LOC~~ SOLN: 40.0000 mg | SUBCUTANEOUS | Status: DC

## 2018-09-17 MED ORDER — ONDANSETRON HCL 4 MG/2ML IJ SOLN: 4.0000 mg | Freq: Four times a day (QID) | INTRAMUSCULAR | Status: DC | PRN

## 2018-09-17 MED ORDER — ASPIRIN 81 MG PO CHEW
324.0000 mg | CHEWABLE_TABLET | Freq: Once | ORAL | Status: AC
  Administered 2018-09-17: 324 mg via ORAL

## 2018-09-17 MED ORDER — PROCHLORPERAZINE EDISYLATE 10 MG/2ML IJ SOLN: 5.0000 mg | INTRAMUSCULAR | Status: DC | PRN

## 2018-09-17 MED ORDER — MAGNESIUM SULFATE 2 GM/50ML IV SOLN
2.0000 g | Freq: Once | INTRAVENOUS | Status: AC
  Administered 2018-09-17: 2 g via INTRAVENOUS
  Filled 2018-09-17: qty 50

## 2018-09-17 MED ORDER — ENOXAPARIN SODIUM 40 MG/0.4ML ~~LOC~~ SOLN
40.0000 mg | SUBCUTANEOUS | Status: DC
  Administered 2018-09-17 – 2018-09-18 (×2): 40 mg via SUBCUTANEOUS
  Filled 2018-09-17 (×2): qty 0.4

## 2018-09-17 MED ORDER — NITROGLYCERIN 2 % TD OINT
0.5000 [in_us] | TOPICAL_OINTMENT | Freq: Four times a day (QID) | TRANSDERMAL | Status: DC
  Administered 2018-09-17 – 2018-09-18 (×3): 0.5 [in_us] via TOPICAL
  Filled 2018-09-17 (×3): qty 1

## 2018-09-17 MED ORDER — SODIUM CHLORIDE 0.9 % IV SOLN
INTRAVENOUS | Status: DC | PRN
  Administered 2018-09-17: 40 mL via INTRAVENOUS

## 2018-09-17 MED ORDER — NITROGLYCERIN 2 % TD OINT
0.5000 [in_us] | TOPICAL_OINTMENT | Freq: Once | TRANSDERMAL | Status: AC
  Administered 2018-09-17: 0.5 [in_us] via TOPICAL
  Filled 2018-09-17: qty 1

## 2018-09-17 MED ORDER — ASPIRIN 81 MG PO CHEW
CHEWABLE_TABLET | ORAL | Status: AC
  Administered 2018-09-17: 324 mg via ORAL
  Filled 2018-09-17: qty 4

## 2018-09-17 MED ORDER — ACETAMINOPHEN 325 MG PO TABS: 650.0000 mg | ORAL_TABLET | ORAL | Status: DC | PRN

## 2018-09-17 MED ORDER — ASPIRIN EC 81 MG PO TBEC
81.0000 mg | DELAYED_RELEASE_TABLET | Freq: Every day | ORAL | Status: DC
  Administered 2018-09-18: 81 mg via ORAL
  Filled 2018-09-17: qty 1

## 2018-09-17 MED ORDER — METOPROLOL TARTRATE 12.5 MG HALF TABLET
12.5000 mg | ORAL_TABLET | Freq: Two times a day (BID) | ORAL | Status: DC
  Administered 2018-09-17: 12.5 mg via ORAL
  Filled 2018-09-17: qty 1

## 2018-09-17 MED ORDER — ALPRAZOLAM 0.25 MG PO TABS
0.2500 mg | ORAL_TABLET | Freq: Two times a day (BID) | ORAL | Status: DC | PRN
  Administered 2018-09-18: 0.25 mg via ORAL
  Filled 2018-09-17: qty 1

## 2018-09-17 MED ORDER — FENTANYL CITRATE (PF) 100 MCG/2ML IJ SOLN: 25.0000 ug | INTRAMUSCULAR | Status: DC | PRN

## 2018-09-17 NOTE — H&P (Addendum)
History and Physical    Teresa Monroe ZOX:[id number] DOB: 28-Jul-1956 DOA: 09/17/2018  PCP: Myrlene Broker, MD   Patient coming from: [facility]  I have personally briefly reviewed patient's old medical records in [facility]  Chief Complaint: Chest pain.  HPI: Teresa Monroe is a 62 y.o. female with medical history significant of allergic rhinitis, depression, hyperlipidemia, GERD, IBS, vitamin B12 deficiency who is coming to the emergency department due to chest pain that started this morning after 9 AM while she was watching TV.  The pain is described as a pressure and radiated mildly to her right mandible area.  She felt mildly clammy, but denies dyspnea, nausea, vomiting, dizziness.  She denies PND, orthopnea or pitting edema of the lower extremities.  No fever, chills, rhinorrhea, sore throat, cough, wheezing or hemoptysis.  No abdominal pain, diarrhea, constipation, melena or hematochezia.  Denies dysuria, frequency or hematuria.  She denies skin rashes.  ED Course: Initial vital signs temperature 98 F, pulse 105, respirations 20, blood pressure 165/95 mmHg and O2 sat 99% on room air.  She received 224 mg of aspirin and Nitropaste in the emergency department.   Her work-up shows a white count of 4.6, hemoglobin 14.8 and platelets 241.  Her BMP was normal, except for glucose of 104 mg/dL.  Troponin #1 was negative.  EKG shows minimal ST elevation on lateral leads.  Borderline prolonged QT.  Chest radiograph does not show any acute cardiopulmonary pathology.  Review of Systems: As per HPI otherwise 10 point review of systems negative.   Past Medical History:  Diagnosis Date  . ALLERGIC RHINITIS   . DEPRESSION    controlled with meds  . Dyslipidemia    Dx CPX 09/2013 - lifestyle changes recommended  . GERD   . IBS (irritable bowel syndrome)   . VITAMIN B12 DEFICIENCY dx 08/2009   self administering monthly IM replacement    Past Surgical History:  Procedure Laterality  Date  . CESAREAN SECTION  1989, 1992   x's 2  . METATARSAL OSTEOTOMY Left 2007   5th MT with rod repair     reports that she has quit smoking. She smoked 1.00 pack per day. She has never used smokeless tobacco. She reports that she drinks alcohol. She reports that she does not use drugs.  No Known Allergies  Family History  Problem Relation Age of Onset  . Lung cancer Father 25  . Colon cancer Brother 70       stage 4, cured with resection, XRT   Prior to Admission medications   Medication Sig Start Date End Date Taking? Authorizing Provider  B-D TB SYRINGE 1CC/25GX5/8" 25G X 5/8" 1 ML MISC USE EVERY MONTH FOR B12 INJECTION 04/20/16   Veryl Speak, FNP  buPROPion (WELLBUTRIN XL) 150 MG 24 hr tablet Take 1 tablet (150 mg total) by mouth daily. 09/04/18   Myrlene Broker, MD  cyanocobalamin (,VITAMIN B-12,) 1000 MCG/ML injection INJECT 1 ML (1,000 MCG TOTAL) INTO THE MUSCLE EVERY 30 (THIRTY) DAYS. 01/16/15   Veryl Speak, FNP  Loratadine 10 MG CAPS Take by mouth at bedtime.      [provider]    Physical Exam: Vitals:   09/17/18 1430 09/17/18 1445 09/17/18 1515 09/17/18 1628  BP: 118/77 119/75 123/76 (!) 153/84  Pulse: 77 83 87 89  Resp: 10 18 12 16   Temp:    (!) 97.4 F (36.3 C)  TempSrc:    Oral  SpO2: 97% 98% 98%  99%  Weight:      Height:        Constitutional: NAD, calm, comfortable Eyes: PERRL, lids and conjunctivae normal ENMT: Mucous membranes are moist. Posterior pharynx clear of any exudate or lesions. Neck: normal, supple, no masses, no thyromegaly Respiratory: clear to auscultation bilaterally, no wheezing, no crackles. Normal respiratory effort.  No accessory muscle use.  Cardiovascular: Regular rate and rhythm, no murmurs / rubs / gallops. No extremity edema. 2+ pedal pulses. No carotid bruits.  Abdomen: Soft, no tenderness, no masses palpated. No hepatosplenomegaly. Bowel sounds positive.  Musculoskeletal: no clubbing / cyanosis.  Good ROM, no contractures. Normal muscle tone.  Skin: no rashes, lesions, ulcers. No induration on limited dermatological examination. Neurologic: CN 2-12 grossly intact. Sensation intact, DTR normal. Strength 5/5 in all 4.  Psychiatric: Normal judgment and insight. Alert and oriented x 4. Normal mood.   Labs on Admission: I have personally reviewed following labs and imaging studies  CBC: Recent Labs  Lab 09/17/18 1251  WBC 4.6  HGB 14.8  HCT 42.3  MCV 101.4*  PLT 241   Basic Metabolic Panel: Recent Labs  Lab 09/17/18 1251  NA 139  K 3.9  CL 102  CO2 22  GLUCOSE 104*  BUN 10  CREATININE 0.78  CALCIUM 9.7   GFR: Estimated Creatinine Clearance: 71.8 mL/min (by C-G formula based on SCr of 0.78 mg/dL). Liver Function Tests: No results for input(s): AST, ALT, ALKPHOS, BILITOT, PROT, ALBUMIN in the last 168 hours. No results for input(s): LIPASE, AMYLASE in the last 168 hours. No results for input(s): AMMONIA in the last 168 hours. Coagulation Profile: No results for input(s): INR, PROTIME in the last 168 hours. Cardiac Enzymes: Recent Labs  Lab 09/17/18 1251  TROPONINI <0.03   BNP (last 3 results) No results for input(s): PROBNP in the last 8760 hours. HbA1C: No results for input(s): HGBA1C in the last 72 hours. CBG: No results for input(s): GLUCAP in the last 168 hours. Lipid Profile: No results for input(s): CHOL, HDL, LDLCALC, TRIG, CHOLHDL, LDLDIRECT in the last 72 hours. Thyroid Function Tests: No results for input(s): TSH, T4TOTAL, FREET4, T3FREE, THYROIDAB in the last 72 hours. Anemia Panel: No results for input(s): VITAMINB12, FOLATE, FERRITIN, TIBC, IRON, RETICCTPCT in the last 72 hours. Urine analysis:    Component Value Date/Time   COLORURINE YELLOW 06/18/2015 1122   APPEARANCEUR CLEAR 06/18/2015 1122   LABSPEC 1.015 06/18/2015 1122   PHURINE 7.0 06/18/2015 1122   GLUCOSEU NEGATIVE 06/18/2015 1122   HGBUR NEGATIVE 06/18/2015 1122   BILIRUBINUR  NEGATIVE 06/18/2015 1122   KETONESUR NEGATIVE 06/18/2015 1122   UROBILINOGEN 0.2 06/18/2015 1122   NITRITE NEGATIVE 06/18/2015 1122   LEUKOCYTESUR NEGATIVE 06/18/2015 1122    Radiological Exams on Admission: Dg Chest 2 View  Result Date: 09/17/2018 CLINICAL DATA:  Pt c/o chest tightness since this am with diaphoresis and dizziness, no cardiac history per ptRecent former smoker EXAM: CHEST - 2 VIEW COMPARISON:  None. FINDINGS: Heart, mediastinum and hila are within normal limits. Lungs are clear.  No pleural effusion or pneumothorax. Old healed left lateral sixth rib fracture. Skeletal structures otherwise unremarkable. IMPRESSION: No active cardiopulmonary disease. Electronically Signed   By: Amie Portland M.D.   On: 09/17/2018 13:23    EKG: Independently reviewed. Vent. rate 111 BPM PR interval * ms QRS duration 93 ms QT/QTc 365/496 ms P-R-T axes 77 71 50 Sinus tachycardia Probable left atrial enlargement Minimal ST depression, lateral leads Borderline prolonged QT interval  Assessment/Plan Principal Problem:   Chest pain Observation/telemetry. Supplemental oxygen. Continue sublingual nitroglycerin as needed. Start low-dose metoprolol. Start daily aspirin. Magnesium sulfate for borderline prolonged QT/QTc Trend troponin levels. Check echocardiogram in am. Consult cardiology if needed.  Active Problems:   Prolonged QT interval  Supplement potassium and magnesium. Avoid QT prolonging medications.    B12 deficiency Continue monthly B12 injections.    Anxiety and depression Continue Wellbutrin XL 150 mg p.o. daily. Alprazolam at bedtime as needed.    Dyslipidemia Not on medical therapy at this time. Check fasting lipids in a.m.   DVT prophylaxis: Lovenox SQ. Code Status: Full code. Family Communication:  Disposition Plan: Observation for troponin level trending and echo in a.m. Consults called: Admission status: Inpatient/telemetry.   Bobette Mo  MD [facility] Pager [phone].  If 7PM-7AM, please contact night-coverage [email or web address] Password [facility]  09/17/2018, 4:36 PM

## 2018-09-17 NOTE — ED Notes (Signed)
ED Provider at bedside. 

## 2018-09-17 NOTE — ED Provider Notes (Signed)
Emergency Department Provider Note   I have reviewed the triage vital signs and the nursing notes.   HISTORY  Chief Complaint Chest Pain   HPI Teresa Monroe is a 62 y.o. female with PMH of GERD, IBS, tobacco use history presents to the emergency department with sudden onset central chest pressure.  Symptoms began at 9 AM this morning.  When she first developed chest pressure she had associated diaphoresis.  She describes the pain as a tightness or heaviness.  She did have some pain radiate to her right jaw.  The or vomiting.  No shortness of breath.  She has no prior history of similar chest pain.  No fevers or chills.  No productive cough.  Patient stopped smoking in July of this year. No recent surgeries, travel, or estrogen containing substances.    Past Medical History:  Diagnosis Date  . ALLERGIC RHINITIS   . DEPRESSION    controlled with meds  . Dyslipidemia    Dx CPX 09/2013 - lifestyle changes recommended  . GERD   . IBS (irritable bowel syndrome)   . VITAMIN B12 DEFICIENCY dx 08/2009   self administering monthly IM replacement    Patient Active Problem List   Diagnosis Date Noted  . Chest pain 09/17/2018  . Routine general medical examination at a health care facility 06/22/2016  . Dyslipidemia   . B12 deficiency 09/11/2009  . Anxiety and depression 09/11/2009  . Former smoker 03/21/2009    Past Surgical History:  Procedure Laterality Date  . CESAREAN SECTION  1989, 1992   x's 2  . METATARSAL OSTEOTOMY Left 2007   5th MT with rod repair   Allergies Patient has no known allergies.  Family History  Problem Relation Age of Onset  . Lung cancer Father 20  . Colon cancer Brother 30       stage 4, cured with resection, XRT    Social History Social History   Tobacco Use  . Smoking status: Former Smoker    Packs/day: 1.00  . Smokeless tobacco: Never Used  Substance Use Topics  . Alcohol use: Yes    Alcohol/week: 0.0 standard drinks    Comment:  occassional wine  . Drug use: No    Review of Systems  Constitutional: No fever/chills Eyes: No visual changes. ENT: No sore throat. Cardiovascular: Positive chest pain. Respiratory: Denies shortness of breath. Gastrointestinal: No abdominal pain.  No nausea, no vomiting.  No diarrhea.  No constipation. Genitourinary: Negative for dysuria. Musculoskeletal: Negative for back pain. Skin: Negative for rash. Neurological: Negative for headaches, focal weakness or numbness.  10-point ROS otherwise negative.  ____________________________________________   PHYSICAL EXAM:  VITAL SIGNS: ED Triage Vitals  Enc Vitals Group     BP 09/17/18 1243 (!) 165/95     Pulse Rate 09/17/18 1243 (!) 105     Resp 09/17/18 1243 20     Temp 09/17/18 1243 98 F (36.7 C)     Temp Source 09/17/18 1243 Oral     SpO2 09/17/18 1243 99 %     Weight 09/17/18 1242 145 lb (65.8 kg)     Height 09/17/18 1242 5\' 7"  (1.702 m)     Pain Score 09/17/18 1242 6   Constitutional: Alert and oriented. Appears in mild discomfort.  Eyes: Conjunctivae are normal.  Head: Atraumatic. Nose: No congestion/rhinnorhea. Mouth/Throat: Mucous membranes are moist.  Oropharynx non-erythematous. Neck: No stridor.  Cardiovascular: Normal rate, regular rhythm. Good peripheral circulation. Grossly normal heart sounds.   Respiratory:  Normal respiratory effort.  No retractions. Lungs CTAB. Gastrointestinal: Soft and nontender. No distention.  Musculoskeletal: No lower extremity tenderness nor edema. No gross deformities of extremities. Neurologic:  Normal speech and language. No gross focal neurologic deficits are appreciated.  Skin:  Skin is warm, dry and intact. No rash noted.  ____________________________________________   LABS (all labs ordered are listed, but only abnormal results are displayed)  Labs Reviewed  BASIC METABOLIC PANEL - Abnormal; Notable for the following components:      Result Value   Glucose, Bld 104  (*)    All other components within normal limits  CBC - Abnormal; Notable for the following components:   MCV 101.4 (*)    MCH 35.5 (*)    RDW 10.9 (*)    All other components within normal limits  TROPONIN I  D-DIMER, QUANTITATIVE (NOT AT [facility])  TROPONIN I   ____________________________________________  EKG   EKG Interpretation  Date/Time:  Sunday September 17 2018 12:46:24 EDT Ventricular Rate:  111 PR Interval:    QRS Duration: 93 QT Interval:  365 QTC Calculation: 496 R Axis:   71 Text Interpretation:  Sinus tachycardia Probable left atrial enlargement Minimal ST depression, lateral leads Borderline prolonged QT interval No STEMI  Confirmed by Alona Bene [id number]) on 09/17/2018 12:49:01 PM       ____________________________________________  RADIOLOGY  Dg Chest 2 View  Result Date: 09/17/2018 CLINICAL DATA:  Pt c/o chest tightness since this am with diaphoresis and dizziness, no cardiac history per ptRecent former smoker EXAM: CHEST - 2 VIEW COMPARISON:  None. FINDINGS: Heart, mediastinum and hila are within normal limits. Lungs are clear.  No pleural effusion or pneumothorax. Old healed left lateral sixth rib fracture. Skeletal structures otherwise unremarkable. IMPRESSION: No active cardiopulmonary disease. Electronically Signed   By: Amie Portland M.D.   On: 09/17/2018 13:23    ____________________________________________   PROCEDURES  Procedure(s) performed:   Procedures  None  ____________________________________________   INITIAL IMPRESSION / ASSESSMENT AND PLAN / ED COURSE  Pertinent labs & imaging results that were available during my care of the patient were reviewed by me and considered in my medical decision making (see chart for details).  Patient presents to the emergency department for evaluation of chest pressure for the past 3 hours.  Patient has some associated diaphoresis.  She is continuing to have 5/10 chest pain.  Plan for aspirin and  nitroglycerin.  Labs pending.  Patient does have mild tachycardia so that a d-dimer although lower suspicion for PE.  Patient does have elevated BMI and recently stopped smoking.  No other CAD risk factors.  Story is concerning, however.  EKG shows ST depressions laterally with no ST elevation.   Troponin, D-dimer, and CXR are negative. Plan for admit with concerning story of pain and risk factor profile along with EKG changes.   Discussed patient's case with Hospitalist to request admission. Patient and family (if present) updated with plan. Care transferred to Hospitalist service.  I reviewed all nursing notes, vitals, pertinent old records, EKGs, labs, imaging (as available).  ____________________________________________  FINAL CLINICAL IMPRESSION(S) / ED DIAGNOSES  Final diagnoses:  Precordial chest pain    MEDICATIONS GIVEN DURING THIS VISIT:  Medications  nitroGLYCERIN (NITROSTAT) SL tablet 0.4 mg (0.4 mg Sublingual Given 09/17/18 1319)  aspirin chewable tablet 324 mg (324 mg Oral Given 09/17/18 1305)  nitroGLYCERIN (NITROGLYN) 2 % ointment 0.5 inch (0.5 inches Topical Given 09/17/18 1332)    Note:  This document was  prepared using Conservation officer, historic buildings and may include unintentional dictation errors.  Alona Bene, MD Emergency Medicine    Long, Arlyss Repress, MD 09/17/18 [phone]

## 2018-09-17 NOTE — ED Notes (Signed)
Patient transported to X-ray 

## 2018-09-17 NOTE — ED Triage Notes (Signed)
Chest pain x 3 hours, diaphoresis. Denies SOB, N/V. Pain described as tightness.

## 2018-09-18 ENCOUNTER — Observation Stay (HOSPITAL_COMMUNITY): Payer: BLUE CROSS/BLUE SHIELD

## 2018-09-18 ENCOUNTER — Encounter (HOSPITAL_COMMUNITY): Payer: Self-pay | Admitting: Physician Assistant

## 2018-09-18 ENCOUNTER — Observation Stay (HOSPITAL_BASED_OUTPATIENT_CLINIC_OR_DEPARTMENT_OTHER): Payer: BLUE CROSS/BLUE SHIELD

## 2018-09-18 DIAGNOSIS — I340 Nonrheumatic mitral (valve) insufficiency: Secondary | ICD-10-CM

## 2018-09-18 DIAGNOSIS — R9431 Abnormal electrocardiogram [ECG] [EKG]: Secondary | ICD-10-CM | POA: Diagnosis not present

## 2018-09-18 DIAGNOSIS — F419 Anxiety disorder, unspecified: Secondary | ICD-10-CM | POA: Diagnosis not present

## 2018-09-18 DIAGNOSIS — E785 Hyperlipidemia, unspecified: Secondary | ICD-10-CM

## 2018-09-18 DIAGNOSIS — F329 Major depressive disorder, single episode, unspecified: Secondary | ICD-10-CM | POA: Diagnosis not present

## 2018-09-18 DIAGNOSIS — E538 Deficiency of other specified B group vitamins: Secondary | ICD-10-CM | POA: Diagnosis not present

## 2018-09-18 DIAGNOSIS — Q231 Congenital insufficiency of aortic valve: Secondary | ICD-10-CM

## 2018-09-18 DIAGNOSIS — R079 Chest pain, unspecified: Secondary | ICD-10-CM | POA: Diagnosis not present

## 2018-09-18 LAB — TROPONIN I

## 2018-09-18 LAB — LIPID PANEL
CHOL/HDL RATIO: 2.4 ratio
CHOLESTEROL: 225 mg/dL — AB (ref 0–200)
HDL: 95 mg/dL (ref >40)
LDL CALC: 110 mg/dL — AB (ref 0–99)
TRIGLYCERIDES: 102 mg/dL (ref <150)
VLDL: 20 mg/dL (ref 0–40)

## 2018-09-18 LAB — ECHOCARDIOGRAM COMPLETE
Height: 67 in
Weight: 2320 oz

## 2018-09-18 MED ORDER — METOPROLOL TARTRATE 12.5 MG HALF TABLET
12.5000 mg | ORAL_TABLET | Freq: Once | ORAL | Status: AC
  Administered 2018-09-18: 12.5 mg via ORAL
  Filled 2018-09-18: qty 1

## 2018-09-18 MED ORDER — NITROGLYCERIN 0.4 MG SL SUBL
SUBLINGUAL_TABLET | SUBLINGUAL | Status: AC
  Filled 2018-09-18: qty 2

## 2018-09-18 MED ORDER — NITROGLYCERIN 0.4 MG SL SUBL
0.8000 mg | SUBLINGUAL_TABLET | Freq: Once | SUBLINGUAL | Status: AC
  Administered 2018-09-18: 0.8 mg via SUBLINGUAL

## 2018-09-18 MED ORDER — IOPAMIDOL (ISOVUE-370) INJECTION 76%
100.0000 mL | Freq: Once | INTRAVENOUS | Status: AC | PRN
  Administered 2018-09-18: 100 mL via INTRAVENOUS

## 2018-09-18 NOTE — Consult Note (Addendum)
Cardiology Consultation:   Patient ID: Teresa Monroe; [id number]; 12-02-1955   Admit date: 09/17/2018 Date of Consult: 09/18/2018  Primary Care Provider: Myrlene Broker, MD Primary Cardiologist: Parke Poisson, MD - New to Dr. Jacques Navy  Chief Complaint: chest pain  Patient Profile:   Teresa Monroe is a 62 y.o. female with a hx of depression, GERD, habitual wine use (3 drinks nightly), former tobacco abuse (10 yrs), mild hyperlipidemia (LDL 117 06/2018), prior B12 deficiency who is being seen today for the evaluation of chest pain at the request of Dr. Benjamine Mola - identified as chest pain unit consult.  History of Present Illness:   Teresa Monroe has no prior cardiac history or family history of CAD. She was in [place] yesterday AM around 9 watching [employer] morning news when she developed susbternal chest pressure with pain in both elbows and jaw pain. She began sweating out of nowhere. She took a shower but symptoms persisted, constant regardless of position changes, inspiration or movement. She had not had pain like this before and just helped a friend move a heavy box on Thurs 10/24. Around noon she began feeling panicky so came to the ER where she was found to be mildly tachycardic (sinus tach 105) and hypertensive at 165/95. These were unusual for her as prior BP in 06/2018 was 108/70. She received 324mg  ASA, NTG SL x 3, then NTG patch. Pain eased off between 12-2 but not immediately after any specific interventions. She was also started on metoprolol with overnight HR in the upper 40s. Labs reveal normal troponin x 3, Hgb 14.8 (elev MCV), LDL 110, Mg 2.1. She received potassium and mag sulfate due to QT prolongation of 496 on admission.  EKG does show nonspecific inferior ST changes but no prior to compare to. VSS and she has remained chest pain free since yesterday afternoon. She is going down for echocardiogram momentarily. Denies illicit drugs or fam hx of CAD.  Past Medical History:   Diagnosis Date  . ALLERGIC RHINITIS   . DEPRESSION    controlled with meds  . Dyslipidemia    Dx CPX 09/2013 - lifestyle changes recommended  . GERD   . IBS (irritable bowel syndrome)   . VITAMIN B12 DEFICIENCY dx 08/2009   previously self administering monthly IM replacement    Past Surgical History:  Procedure Laterality Date  . CESAREAN SECTION  1989, 1992   x's 2  . METATARSAL OSTEOTOMY Left 2007   5th MT with rod repair     Inpatient Medications: Scheduled Meds: . aspirin EC  81 mg Oral Daily  . enoxaparin (LOVENOX) injection  40 mg Subcutaneous Q24H  . metoprolol tartrate  12.5 mg Oral BID  . nitroGLYCERIN  0.5 inch Topical Q6H   Continuous Infusions: . sodium chloride 40 mL (09/17/18 2025)   PRN Meds: sodium chloride, acetaminophen, ALPRAZolam, fentaNYL (SUBLIMAZE) injection, nitroGLYCERIN, prochlorperazine  Home Meds: Prior to Admission medications   Medication Sig Start Date End Date Taking? Authorizing Provider  buPROPion (WELLBUTRIN XL) 150 MG 24 hr tablet Take 1 tablet (150 mg total) by mouth daily. 09/04/18  Yes Myrlene Broker, MD  cyanocobalamin (,VITAMIN B-12,) 1000 MCG/ML injection INJECT 1 ML (1,000 MCG TOTAL) INTO THE MUSCLE EVERY 30 (THIRTY) DAYS. 01/16/15  Yes Veryl Speak, FNP  Loratadine 10 MG CAPS Take by mouth at bedtime.     Yes [provider]  Multiple Vitamin (MULTIVITAMIN WITH MINERALS) TABS tablet Take 1 tablet by mouth daily.   Yes  [provider]  tetrahydrozoline-zinc (VISINE-AC) 0.05-0.25 % ophthalmic solution Place 2 drops into both eyes as needed (itching).   Yes [provider]  B-D TB SYRINGE 1CC/25GX5/8" 25G X 5/8" 1 ML MISC USE EVERY MONTH FOR B12 INJECTION 04/20/16   Veryl Speak, FNP    Allergies:   No Known Allergies  Social History:   Social History   Socioeconomic History  . Marital status: Divorced    Spouse name: Not on file  . Number of children: Not on file  . Years of  education: Not on file  . Highest education level: Not on file  Occupational History  . Not on file  Social Needs  . Financial resource strain: Not hard at all  . Food insecurity:    Worry: Never true    Inability: Never true  . Transportation needs:    Medical: No    Non-medical: No  Tobacco Use  . Smoking status: Former Smoker    Packs/day: 1.00    Years: 10.00    Pack years: 10.00  . Smokeless tobacco: Never Used  . Tobacco comment: Smoked for 10 yrs, quit 05/2018  Substance and Sexual Activity  . Alcohol use: Yes    Alcohol/week: 0.0 standard drinks    Comment: 3 glasses of wine nightly  . Drug use: No  . Sexual activity: Not on file  Lifestyle  . Physical activity:    Days per week: 7 days    Minutes per session: 30 min  . Stress: Only a little  Relationships  . Social connections:    Talks on phone: More than three times a week    Gets together: More than three times a week    Attends religious service: 1 to 4 times per year    Active member of club or organization: No    Attends meetings of clubs or organizations: Never    Relationship status: Divorced  . Intimate partner violence:    Fear of current or ex partner: No    Emotionally abused: No    Physically abused: No    Forced sexual activity: No  Other Topics Concern  . 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  ROS:  Please see the history of present illness.  All other ROS reviewed and negative.     Physical Exam/Data:   Vitals:   09/17/18 2043 09/18/18 0300 09/18/18 0400 09/18/18 0622  BP:  121/79  120/87  Pulse:   (!) 57 70  Resp:  18  20  Temp:    97.8 F (36.6 C)    TempSrc:    Oral  SpO2: 100% 100%  99%  Weight:      Height:        Intake/Output Summary (Last 24 hours) at 09/18/2018 0805 Last data filed at 09/18/2018 0500 Gross per 24 hour  Intake 0 ml  Output -  Net 0 ml   Filed Weights   09/17/18 1242  Weight: 65.8 kg   Body mass index is 22.71 kg/m.  General: Well developed, well nourished WF, in no acute distress. Head: Normocephalic, atraumatic, sclera non-icteric, no xanthomas, nares are  without discharge.  Neck: Negative for carotid bruits. JVD not elevated. Lungs: Clear bilaterally to auscultation without wheezes, rales, or rhonchi. Breathing is unlabored. Heart: RRR with S1 S2. No murmurs, rubs, or gallops appreciated. Abdomen: Soft, non-tender, non-distended with normoactive bowel sounds. No hepatomegaly. No rebound/guarding. No obvious abdominal masses. Msk:  Strength and tone appear normal for age. Extremities: No clubbing or cyanosis. No edema.  Distal pedal pulses are 2+ and equal bilaterally. Neuro: Alert and oriented X 3. No facial asymmetry. No focal deficit. Moves all extremities spontaneously. Psych:  Responds to questions appropriately with a normal affect.  EKG:  The EKG was personally reviewed and demonstrates sinus tach 111, possible LAE, minimal ST depression inferiorly as well as nonspecific ST changes in V4-V6. Mild baseline wander noted.  F/u EKG this AM shows NSR with again nonspecific ST-T Changes. QTc .  Relevant CV Studies: None  Laboratory Data:  Chemistry Recent Labs  Lab 09/17/18 1251  NA 139  K 3.9  CL 102  CO2 22  GLUCOSE 104*  BUN 10  CREATININE 0.78  CALCIUM 9.7  GFRNONAA >60  GFRAA >60  ANIONGAP 15    No results for input(s): PROT, ALBUMIN, AST, ALT, ALKPHOS, BILITOT in the last 168 hours. Hematology Recent Labs  Lab 09/17/18 1251  WBC 4.6  RBC 4.17  HGB 14.8  HCT 42.3  MCV 101.4*  MCH 35.5*  MCHC 35.0  RDW 10.9*  PLT 241   Cardiac Enzymes Recent Labs  Lab  09/17/18 1251 09/17/18 2139 09/18/18 0406  TROPONINI <0.03 <0.03 <0.03   No results for input(s): TROPIPOC in the last 168 hours.  BNPNo results for input(s): BNP, PROBNP in the last 168 hours.  DDimer  Recent Labs  Lab 09/17/18 1251  DDIMER 0.30    Radiology/Studies:  Dg Chest 2 View  Result Date: 09/17/2018 CLINICAL DATA:  Pt c/o chest tightness since this am with diaphoresis and dizziness, no cardiac history per ptRecent former smoker EXAM: CHEST - 2 VIEW COMPARISON:  None. FINDINGS: Heart, mediastinum and hila are within normal limits. Lungs are clear.  No pleural effusion or pneumothorax. Old healed left lateral sixth rib fracture. Skeletal structures otherwise unremarkable. IMPRESSION: No active cardiopulmonary disease. Electronically Signed   By: Amie Portland M.D.   On: 09/17/2018 13:23    Assessment and Plan:   1. Chest pain - with mixed features and some symptoms concerning for unstable angina, but troponins negative despite persistent discomfort. EKG abnormal without prior to compare to. Patient just went to 2D echo so will see what this shows, pending review with MD.  2. QT prolongation - improved with supplemental Mag and K. Can continue to follow.  3. Sinus tach and elevated blood pressure on arrival - question due to panic reaction. Hold off further metoprolol given resting bradycardia this AM. Follow BP as this was previously normal in non-acute setting.  4. Mild dyslipidemia - not on medical therapy per direction of primary care.  5. Habitual wine intake - discussed cutting down with patient.  For questions or updates, please contact [facility] Please consult [email or web address] for contact info under Cardiology/STEMI.    Signed, Laurann Montana, PA-C  09/18/2018 8:05 AM ---------------------------------------------------------------------------------------------   History and all data above reviewed.  Patient examined.  I agree with the findings as  above.  Teresa Monroe developed resting chest pressure that radiated to the bilateral arms and jaw. Shew as ruled out for ACS, and an echocardiogram was performed this morning.  She  is chest pain free.  Constitutional: No acute distress Eyes: pupils equally round and reactive to light, sclera non-icteric, normal conjunctiva and lids ENMT: normal dentition, moist mucous membranes Cardiovascular: regular rhythm, normal rate, no murmurs. S1 and S2 normal. Radial pulses normal bilaterally. No jugular venous distention.  Respiratory: clear to auscultation bilaterally GI : normal bowel sounds, soft and nontender. No distention.   MSK: extremities warm, well perfused. No edema.  LYMPH: No lymphadenopathy noted of the head and neck NEURO: grossly nonfocal exam, moves all extremities. PSYCH: alert and oriented x 3, normal mood and affect.   All available labs, radiology testing, previous records reviewed. Agree with documented assessment and plan of my colleague as stated above with the following additions or changes:  Principal Problem:   Chest pain Active Problems:   B12 deficiency   Anxiety and depression   Dyslipidemia   Prolonged QT interval   Bicuspid aortic valve   Plan: Her echocardiogram reveals normal LV function and no regional wall motion abnormalities.  She does however have a bicuspid aortic valve with fusion of the right and left coronary cusp.   I have reviewed the images independently.  Her chest pain does sound concerning for cardiac chest pain with unstable features, and with this in mind and her newly diagnosed bicuspid aortic valve, we will obtain a coronary CTA and extend the field of view to include the thoracic aorta to evaluate for features of dilatation, focal aneurysm, penetrating ulcer, or dissection.  We will evaluate her coronary arteries given her concerning sounding typical chest pain.  Results of her CT scan are pending at this time. Parke Poisson,  MD [facility] 12:48 PM  09/18/2018

## 2018-09-18 NOTE — Progress Notes (Signed)
  Echocardiogram 2D Echocardiogram has been performed.  Teresa Monroe 09/18/2018, 8:44 AM

## 2018-09-18 NOTE — Discharge Summary (Signed)
Physician Discharge Summary  Teresa Monroe ZOX:[id number] DOB: 16-Jan-1956 DOA: 09/17/2018  PCP: Myrlene Broker, MD  Admit date: 09/17/2018 Discharge date: 09/18/2018  Admitted From: home Discharge disposition: home   Recommendations for Outpatient Follow-Up:   1. outpatient bicuspid valve follow up   Discharge Diagnosis:   Principal Problem:   Chest pain Active Problems:   B12 deficiency   Anxiety and depression   Dyslipidemia   Prolonged QT interval   Bicuspid aortic valve    Discharge Condition: Improved.  Diet recommendation: Low sodium, heart healthy  Wound care: None.  Code status: Full.   History of Present Illness:   Teresa Monroe is a 62 y.o. female with medical history significant of allergic rhinitis, depression, hyperlipidemia, GERD, IBS, vitamin B12 deficiency who is coming to the emergency department due to chest pain that started this morning after 9 AM while she was watching TV.  The pain is described as a pressure and radiated mildly to her right mandible area.  She felt mildly clammy, but denies dyspnea, nausea, vomiting, dizziness.  She denies PND, orthopnea or pitting edema of the lower extremities.  No fever, chills, rhinorrhea, sore throat, cough, wheezing or hemoptysis.  No abdominal pain, diarrhea, constipation, melena or hematochezia.  Denies dysuria, frequency or hematuria.  She denies skin rashes.   Hospital Course by Problem:    Chest pain -CE negative -s/p CTA-- bicuspid aortic valve, further plans per cardiology    Prolonged QT interval  Supplement potassium and magnesium.    B12 deficiency Continue monthly B12 injections.    Anxiety and depression Continue Wellbutrin XL 150 mg p.o. daily. Alprazolam at bedtime as needed.    Dyslipidemia LDL 110- defer to PCP for medications  Alcohol use -encouraged moderation   Medical Consultants:   cards   Discharge Exam:   Vitals:   09/18/18 1000  09/18/18 1336  BP: 122/81 125/72  Pulse: 63 (!) 57  Resp: 16 16  Temp: 98 F (36.7 C) 98.6 F (37 C)  SpO2: 99% 100%   Vitals:   09/18/18 0400 09/18/18 0622 09/18/18 1000 09/18/18 1336  BP:  120/87 122/81 125/72  Pulse: (!) 57 70 63 (!) 57  Resp:  20 16 16   Temp:  97.8 F (36.6 C) 98 F (36.7 C) 98.6 F (37 C)  TempSrc:  Oral Oral Oral  SpO2:  99% 99% 100%  Weight:      Height:        General exam: Appears calm and comfortable.    The results of significant diagnostics from this hospitalization (including imaging, microbiology, ancillary and laboratory) are listed below for reference.     Procedures and Diagnostic Studies:   Dg Chest 2 View  Result Date: 09/17/2018 CLINICAL DATA:  Pt c/o chest tightness since this am with diaphoresis and dizziness, no cardiac history per ptRecent former smoker EXAM: CHEST - 2 VIEW COMPARISON:  None. FINDINGS: Heart, mediastinum and hila are within normal limits. Lungs are clear.  No pleural effusion or pneumothorax. Old healed left lateral sixth rib fracture. Skeletal structures otherwise unremarkable. IMPRESSION: No active cardiopulmonary disease. Electronically Signed   By: Amie Portland M.D.   On: 09/17/2018 13:23   Ct Coronary Morph W/cta Cor W/score W/ca W/cm &/or Wo/cm  Addendum Date: 09/18/2018   ADDENDUM REPORT: 09/18/2018 16:04 HISTORY: Chest pain EXAM: Cardiac/Coronary  CT TECHNIQUE: The patient was scanned on a [employer]. PROTOCOL: A 120 kV prospective scan was triggered in the descending  thoracic aorta at 111 HU's. Axial non-contrast 3 mm slices were carried out through the heart. The data set was analyzed on a dedicated work station and scored using the Agatson method. Gantry rotation speed was 250 msecs and collimation was .6 mm. No beta blockade and 0.8 mg of sl NTG was given. The 3D data set was reconstructed in 5% intervals of the 67-82 % of the R-R cycle. Diastolic phases were analyzed on a dedicated work station  using MPR, MIP and VRT modes. The patient received 80 cc of contrast. FINDINGS: Coronary calcium score: The patient's coronary artery calcium score is 0, which places the patient in the 0 percentile. Coronary arteries: Normal coronary origins.  Left dominance. Right Coronary Artery: Nondominant, small caliber right coronary artery is patent without plaque or stenosis. Left Main Coronary Artery: No detectable plaque or stenosis. Left Anterior Descending Coronary Artery: No detectable plaque or stenosis. Patent diagonal branches and septal perforators. Left Circumflex Artery: Dominant left circumflex artery with no detectable plaque or stenosis. Large OM 1 and OM2. Patent PDA and posterolateral branches. Aorta: Normal size. 38 mm at the mid ascending aorta measured double oblique at the level of the pulmonary bifurcation. No calcifications. No dissection. Limited assessment of the ascending aorta due to focused field of view. Consider CTA thoracic aorta for future exams for full assessment of aorta. Aortic Valve: Bicuspid aortic valve with fusion of right and left coronary cusps. No calcifications. Other findings: Normal pulmonary vein drainage into the left atrium. Normal left atrial appendage without a thrombus. Normal size of the pulmonary artery. IMPRESSION: 1. Coronary calcium score of 0. This was 0 percentile for age and sex matched control. 2. Normal coronary origin with left dominance. 3. No evidence of CAD, CADRADS = 0. 4. Bicuspid aortic valve with normal leaflet excursion. Mid ascending aorta measures 38 mm. Recommendations: Follow up CT scan should include complete ecg-gated assessment of thoracic aorta. Electronically Signed   By: Weston Brass   On: 09/18/2018 16:04   Result Date: 09/18/2018 EXAM: OVER-READ INTERPRETATION  CT CHEST The following report is an over-read performed by radiologist Dr. Charlett Nose of [facility] on 09/18/2018. This over-read does not include interpretation  of cardiac or coronary anatomy or pathology. The coronary CTA interpretation by the cardiologist is attached. COMPARISON:  None. FINDINGS: Vascular: Heart is normal size. Ascending thoracic aorta borderline upper limits normal in diameter at 3.9 cm. Mediastinum/Nodes: No adenopathy in the lower mediastinum or hila. Lungs/Pleura: Visualized lungs clear.  No effusions. Upper Abdomen: Imaging into the upper abdomen shows no acute findings. Musculoskeletal: Chest wall soft tissues are unremarkable. No acute bony abnormality. IMPRESSION: Ascending aorta upper limits normal in diameter at 3.9 cm. No acute extra cardiac abnormality. Electronically Signed: By: Charlett Nose M.D. On: 09/18/2018 14:19     Labs:   Basic Metabolic Panel: Recent Labs  Lab 09/17/18 1251 09/17/18 1654  NA 139  --   K 3.9  --   CL 102  --   CO2 22  --   GLUCOSE 104*  --   BUN 10  --   CREATININE 0.78  --   CALCIUM 9.7  --   MG  --  2.1   GFR Estimated Creatinine Clearance: 71.8 mL/min (by C-G formula based on SCr of 0.78 mg/dL). Liver Function Tests: No results for input(s): AST, ALT, ALKPHOS, BILITOT, PROT, ALBUMIN in the last 168 hours. No results for input(s): LIPASE, AMYLASE in the last 168 hours. No results  for input(s): AMMONIA in the last 168 hours. Coagulation profile No results for input(s): INR, PROTIME in the last 168 hours.  CBC: Recent Labs  Lab 09/17/18 1251  WBC 4.6  HGB 14.8  HCT 42.3  MCV 101.4*  PLT 241   Cardiac Enzymes: Recent Labs  Lab 09/17/18 1251 09/17/18 2139 09/18/18 0406  TROPONINI <0.03 <0.03 <0.03   BNP: Invalid input(s): POCBNP CBG: No results for input(s): GLUCAP in the last 168 hours. D-Dimer Recent Labs    09/17/18 1251  DDIMER 0.30   Hgb A1c No results for input(s): HGBA1C in the last 72 hours. Lipid Profile Recent Labs    09/18/18 0406  CHOL 225*  HDL 95  LDLCALC 110*  TRIG 102  CHOLHDL 2.4   Thyroid function studies No results for input(s):  TSH, T4TOTAL, T3FREE, THYROIDAB in the last 72 hours.  Invalid input(s): FREET3 Anemia work up No results for input(s): VITAMINB12, FOLATE, FERRITIN, TIBC, IRON, RETICCTPCT in the last 72 hours. Microbiology No results found for this or any previous visit (from the past 240 hour(s)).   Discharge Instructions:   Discharge Instructions    Diet - low sodium heart healthy   Complete by:  As directed    Increase activity slowly   Complete by:  As directed      Allergies as of 09/18/2018   No Known Allergies     Medication List    TAKE these medications   B-D TB SYRINGE 1CC/25GX5/8" 25G X 5/8" 1 ML Misc Generic drug:  TUBERCULIN SYR 1CC/25GX5/8" USE EVERY MONTH FOR B12 INJECTION   buPROPion 150 MG 24 hr tablet Commonly known as:  WELLBUTRIN XL Take 1 tablet (150 mg total) by mouth daily.   cyanocobalamin 1000 MCG/ML injection Commonly known as:  (VITAMIN B-12) INJECT 1 ML (1,000 MCG TOTAL) INTO THE MUSCLE EVERY 30 (THIRTY) DAYS.   Loratadine 10 MG Caps Take by mouth at bedtime.   multivitamin with minerals Tabs tablet Take 1 tablet by mouth daily.   tetrahydrozoline-zinc 0.05-0.25 % ophthalmic solution Commonly known as:  VISINE-AC Place 2 drops into both eyes as needed (itching).      Follow-up Information    Myrlene Broker, MD Follow up in 1 week(s).   Specialty:  Internal Medicine Contact information: [address] [place] [address] [phone]        Parke Poisson, MD .   Specialty:  Cardiology Contact information: [address] [place] [address] [place] [address] [phone]            Time coordinating discharge: 25 min  Signed:  Joseph Art DO  [facility] 09/18/2018, 6:26 PM

## 2018-09-18 NOTE — Progress Notes (Signed)
Per d/w Dr. Jacques Navy, plan Cardiac CTA instead which will also eval ascending aorta. Per d/w MD, will give Lopressor 12.5mg  this AM (HR was in upper 40s during AM hours, currently 69-75 per nurse). I called nuc med to make them aware. Anakin Varkey PA-C

## 2018-09-26 ENCOUNTER — Encounter: Payer: Self-pay | Admitting: Internal Medicine

## 2018-09-27 MED ORDER — CYANOCOBALAMIN 1000 MCG/ML IJ SOLN: INTRAMUSCULAR | 3 refills | Status: DC

## 2018-09-27 MED ORDER — "TUBERCULIN SYRINGE 25G X 5/8"" 1 ML MISC": 0 refills | Status: DC

## 2018-10-25 ENCOUNTER — Encounter: Payer: Self-pay | Admitting: Internal Medicine

## 2018-10-25 NOTE — Progress Notes (Signed)
Abstracted and sent to scan  

## 2018-11-29 ENCOUNTER — Telehealth: Payer: Self-pay

## 2018-11-29 NOTE — Telephone Encounter (Signed)
Left message asking patient to call back to schedule nurse visit to get 2nd shingrix vaccine---vaccine labeled and placed in refrig--can talk with Mahina Salatino,RN at [facility] if any further questions

## 2019-01-09 ENCOUNTER — Ambulatory Visit (INDEPENDENT_AMBULATORY_CARE_PROVIDER_SITE_OTHER): Payer: BLUE CROSS/BLUE SHIELD

## 2019-01-09 DIAGNOSIS — Z23 Encounter for immunization: Secondary | ICD-10-CM

## 2019-02-06 ENCOUNTER — Encounter: Payer: Self-pay | Admitting: Internal Medicine

## 2019-08-06 ENCOUNTER — Other Ambulatory Visit: Payer: Self-pay | Admitting: Internal Medicine

## 2019-08-17 ENCOUNTER — Encounter: Payer: Self-pay | Admitting: Internal Medicine

## 2019-08-20 ENCOUNTER — Other Ambulatory Visit: Payer: Self-pay | Admitting: *Deleted

## 2019-08-20 ENCOUNTER — Other Ambulatory Visit: Payer: Self-pay | Admitting: Internal Medicine

## 2019-08-20 MED ORDER — BUPROPION HCL ER (XL) 150 MG PO TB24: 150.0000 mg | ORAL_TABLET | Freq: Every day | ORAL | 0 refills | Status: DC

## 2019-08-30 IMAGING — CT CT HEART MORP W/ CTA COR W/ SCORE W/ CA W/CM &/OR W/O CM
4 of 7 series · 8 of 20 positions shown, 9 images · non-contrast
Comparison: None.

HISTORY: Chest pain

EXAM:
Cardiac/Coronary  CT
TECHNIQUE: The patient was scanned on a Siemens Force scanner.
PROTOCOL: A 120 kV prospective scan was triggered in the descending thoracic
aorta at 111 HU's. Axial non-contrast 3 mm slices were carried out
through the heart. The data set was analyzed on a dedicated work
station and scored using the Agatson method. Gantry rotation speed
was 250 msecs and collimation was .6 mm. No beta blockade and 0.8 mg
of sl NTG was given. The 3D data set was reconstructed in 5%
intervals of the 67-82 % of the R-R cycle. Diastolic phases were
analyzed on a dedicated work station using MPR, MIP and VRT modes.
The patient received 80 cc of contrast.

[Series 6: best diast 75 % · axial · 0.32mm/px · z∈[+958,+1002]mm · 2 of 338 slices shown, 3 images]
[im 113/338  vessel]
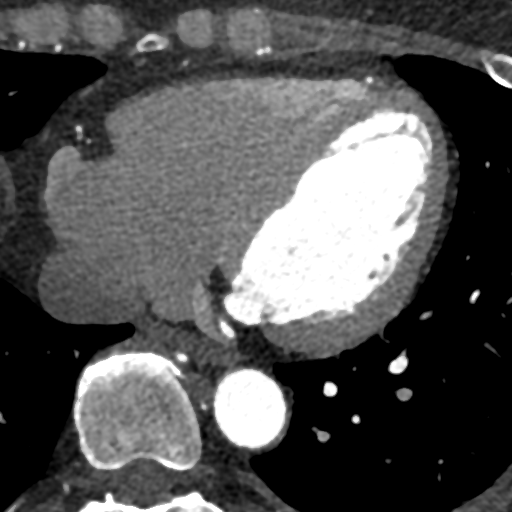
[im 113/338  lung]
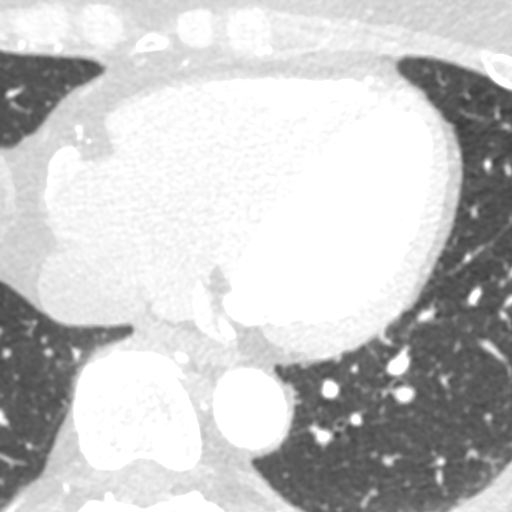
[im 225/338  vessel]
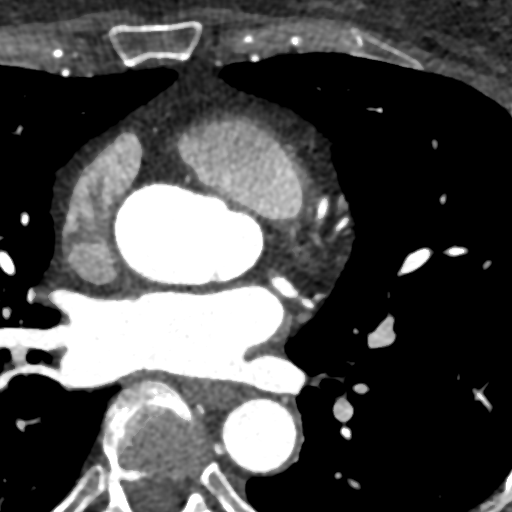

[Series 7: best syst 44 % · axial · 0.32mm/px · z∈[+958,+1002]mm · 2 of 338 slices shown]
[im 113/338  vessel]
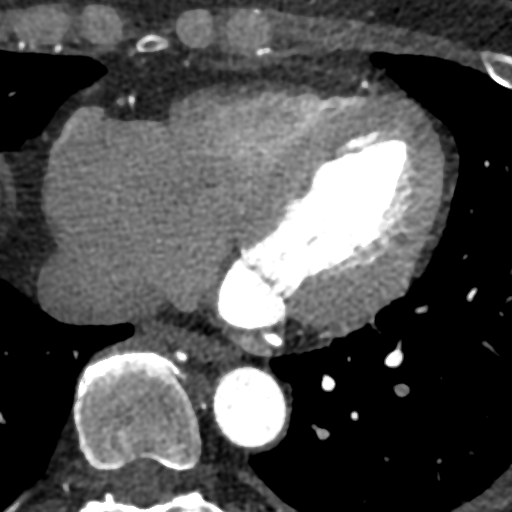
[im 225/338  vessel]
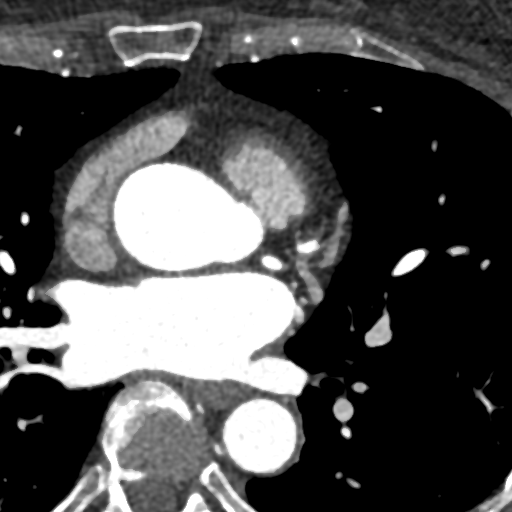

[Series 8: ts diast sharp 75 % · axial · 0.32mm/px · z∈[+958,+1002]mm · 2 of 338 slices shown]
[im 113/338  lung]
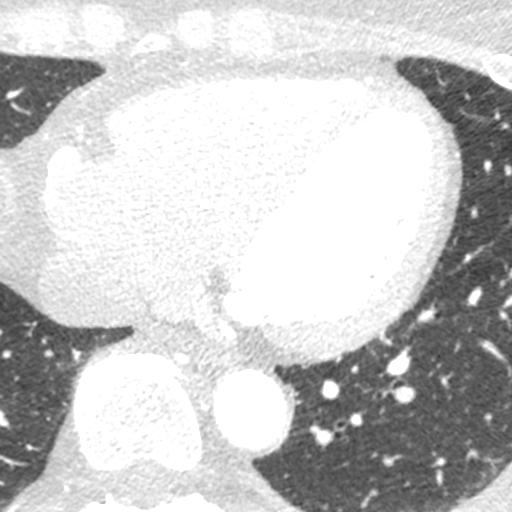
[im 225/338  lung]
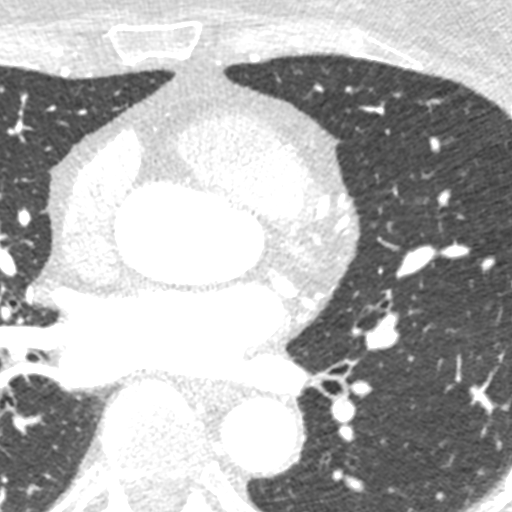

[Series 9: ts syst sharp 44 % · axial · 0.32mm/px · z∈[+958,+1002]mm · 2 of 338 slices shown]
[im 113/338  lung]
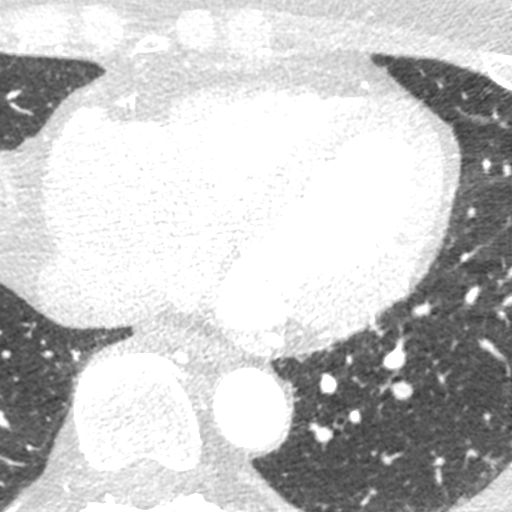
[im 225/338  lung]
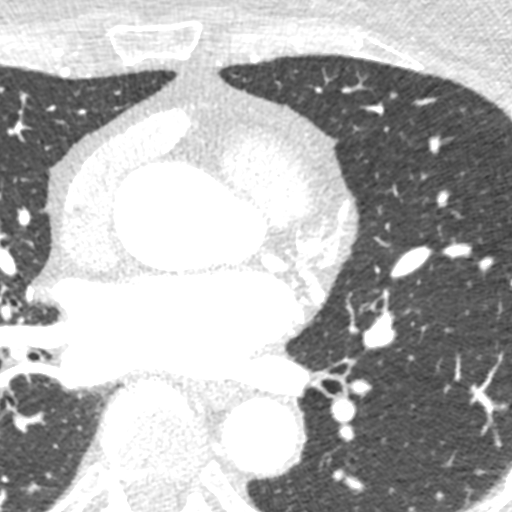

[8 of 20 positions shown; findings below may reference images not displayed]

FINDINGS: Coronary calcium score: The patient's coronary artery calcium score
is 0, which places the patient in the 0 percentile.

Coronary arteries: Normal coronary origins.  Left dominance.

Right Coronary Artery: Nondominant, small caliber right coronary
artery is patent without plaque or stenosis.

Left Main Coronary Artery: No detectable plaque or stenosis.

Left Anterior Descending Coronary Artery: No detectable plaque or
stenosis. Patent diagonal branches and septal perforators.

Left Circumflex Artery: Dominant left circumflex artery with no
detectable plaque or stenosis. Large OM 1 and OM2. Patent PDA and
posterolateral branches.

Aorta: Normal size. 38 mm at the mid ascending aorta measured double
oblique at the level of the pulmonary bifurcation. No
calcifications. No dissection. Limited assessment of the ascending
aorta due to focused field of view. Consider CTA thoracic aorta for
future exams for full assessment of aorta.

Aortic Valve: Bicuspid aortic valve with fusion of right and left
coronary cusps. No calcifications.

Other findings:

Normal pulmonary vein drainage into the left atrium.

Normal left atrial appendage without a thrombus.

Normal size of the pulmonary artery.
IMPRESSION: 1. Coronary calcium score of 0. This was 0 percentile for age and
sex matched control.

2. Normal coronary origin with left dominance.

3. No evidence of CAD, CADRADS = 0.

4. Bicuspid aortic valve with normal leaflet excursion. Mid
ascending aorta measures 38 mm.

Recommendations: Follow up CT scan should include complete ecg-gated
assessment of thoracic aorta.

EXAM:
OVER-READ INTERPRETATION  CT CHEST

The following report is an over-read performed by radiologist Dr.
Michale Garica [[email or web address]] on 09/18/2018. This
over-read does not include interpretation of cardiac or coronary
anatomy or pathology. The coronary CTA interpretation by the
cardiologist is attached.
FINDINGS: Vascular: Heart is normal size. Ascending thoracic aorta borderline
upper limits normal in diameter at 3.9 cm.

Mediastinum/Nodes: No adenopathy in the lower mediastinum or hila.

Lungs/Pleura: Visualized lungs clear.  No effusions.

Upper Abdomen: Imaging into the upper abdomen shows no acute
findings.

Musculoskeletal: Chest wall soft tissues are unremarkable. No acute
bony abnormality.
IMPRESSION: Ascending aorta upper limits normal in diameter at 3.9 cm. No acute
extra cardiac abnormality.

## 2019-09-13 ENCOUNTER — Ambulatory Visit (INDEPENDENT_AMBULATORY_CARE_PROVIDER_SITE_OTHER): Payer: BC Managed Care – PPO | Admitting: Internal Medicine

## 2019-09-13 ENCOUNTER — Encounter: Payer: Self-pay | Admitting: Internal Medicine

## 2019-09-13 DIAGNOSIS — F32A Depression, unspecified: Secondary | ICD-10-CM

## 2019-09-13 DIAGNOSIS — F329 Major depressive disorder, single episode, unspecified: Secondary | ICD-10-CM | POA: Diagnosis not present

## 2019-09-13 DIAGNOSIS — E538 Deficiency of other specified B group vitamins: Secondary | ICD-10-CM | POA: Diagnosis not present

## 2019-09-13 DIAGNOSIS — Z0000 Encounter for general adult medical examination without abnormal findings: Secondary | ICD-10-CM

## 2019-09-13 DIAGNOSIS — F419 Anxiety disorder, unspecified: Secondary | ICD-10-CM

## 2019-09-13 MED ORDER — CYANOCOBALAMIN 1000 MCG/ML IJ SOLN: INTRAMUSCULAR | 3 refills | Status: DC

## 2019-09-13 MED ORDER — BUPROPION HCL ER (XL) 150 MG PO TB24: 150.0000 mg | ORAL_TABLET | Freq: Every day | ORAL | 3 refills | Status: DC

## 2019-09-13 MED ORDER — "TUBERCULIN SYRINGE 25G X 5/8"" 1 ML MISC": 0 refills | Status: DC

## 2019-09-13 NOTE — Assessment & Plan Note (Signed)
Taking wellbutrin and this is still helping. Coping well with pandemic.

## 2019-09-13 NOTE — Progress Notes (Signed)
Virtual Visit via Video Note  I connected with Teresa Monroe on 09/13/19 at  1:20 PM EDT by a video enabled telemedicine application and verified that I am speaking with the correct person using two identifiers.  The patient and the provider were at separate locations throughout the entire encounter.   I discussed the limitations of evaluation and management by telemedicine and the availability of in person appointments. The patient expressed understanding and agreed to proceed. The patient and the provider were the only parties present for the visit unless noted in HPI below.  History of Present Illness: The patient is a 63 y.o. female with visit for physical.   PMH, [place], social history reviewed and updated  Observations/Objective: Appearance: normal, breathing appears normal, casual grooming, abdomen does not appear distended, throat normal, memory normal, mental status is A and O times 3  Assessment and Plan: See problem oriented charting  Follow Up Instructions: skip labs this year, get mammogram records, counseled to get flu shot  I discussed the assessment and treatment plan with the patient. The patient was provided an opportunity to ask questions and all were answered. The patient agreed with the plan and demonstrated an understanding of the instructions.   The patient was advised to call back or seek an in-person evaluation if the symptoms worsen or if the condition fails to improve as anticipated.  Hoyt Koch, MD

## 2019-09-13 NOTE — Assessment & Plan Note (Signed)
Refill and continue monthly injections.

## 2019-09-13 NOTE — Assessment & Plan Note (Signed)
Flu shot counseled. Shingrix complete. Tetanus up to date. Colonoscopy up to date. Mammogram up to date with gyn, pap smear up to date. Counseled about sun safety and mole surveillance. Counseled about the dangers of distracted driving. Given 10 year screening recommendations.

## 2019-09-14 ENCOUNTER — Encounter: Payer: Self-pay | Admitting: Internal Medicine

## 2019-09-14 NOTE — Telephone Encounter (Signed)
Okay to order labs or does patient need to be seen in the office?

## 2019-09-14 NOTE — Progress Notes (Signed)
Abstracted and sent to scan  

## 2019-10-10 ENCOUNTER — Encounter: Payer: Self-pay | Admitting: Internal Medicine

## 2019-10-10 DIAGNOSIS — Z0000 Encounter for general adult medical examination without abnormal findings: Secondary | ICD-10-CM

## 2019-11-13 ENCOUNTER — Other Ambulatory Visit: Payer: BC Managed Care – PPO

## 2019-11-13 ENCOUNTER — Ambulatory Visit: Payer: BC Managed Care – PPO | Attending: Internal Medicine

## 2019-11-13 DIAGNOSIS — R238 Other skin changes: Secondary | ICD-10-CM

## 2019-11-13 DIAGNOSIS — U071 COVID-19: Secondary | ICD-10-CM

## 2019-11-14 LAB — NOVEL CORONAVIRUS, NAA: SARS-CoV-2, NAA: NOT DETECTED

## 2019-11-19 ENCOUNTER — Other Ambulatory Visit: Payer: BC Managed Care – PPO

## 2019-11-20 ENCOUNTER — Ambulatory Visit: Payer: BC Managed Care – PPO | Attending: Internal Medicine

## 2019-11-20 DIAGNOSIS — R238 Other skin changes: Secondary | ICD-10-CM

## 2019-11-20 DIAGNOSIS — U071 COVID-19: Secondary | ICD-10-CM

## 2019-11-21 LAB — NOVEL CORONAVIRUS, NAA: SARS-CoV-2, NAA: NOT DETECTED

## 2020-01-11 ENCOUNTER — Other Ambulatory Visit: Payer: Self-pay | Admitting: Internal Medicine

## 2020-01-11 MED ORDER — "TUBERCULIN SYRINGE 25G X 5/8"" 1 ML MISC": 0 refills | Status: DC

## 2020-04-07 ENCOUNTER — Ambulatory Visit: Payer: BC Managed Care – PPO | Admitting: Gastroenterology

## 2020-09-15 ENCOUNTER — Other Ambulatory Visit: Payer: Self-pay | Admitting: Internal Medicine

## 2020-09-15 ENCOUNTER — Encounter: Payer: Self-pay | Admitting: Internal Medicine

## 2020-09-15 MED ORDER — BUPROPION HCL ER (XL) 150 MG PO TB24: 150.0000 mg | ORAL_TABLET | Freq: Every day | ORAL | 0 refills | Status: DC

## 2020-09-22 ENCOUNTER — Telehealth: Payer: Self-pay | Admitting: Internal Medicine

## 2020-09-22 NOTE — Telephone Encounter (Signed)
Denied. A 90-day supply was sent in on 10/25.

## 2020-09-22 NOTE — Telephone Encounter (Signed)
Patient called and is requesting a med refill for buPROPion (WELLBUTRIN XL) 150 MG 24 hr tablet  She just had it refilled on 10.25.2021 but is wondering if another supply can be called in since she wont see Dr. Okey Dupre until the end of December 2021. She sees Dr. Okey Dupre on 12.30.2021   It can be sent to [id number] - [place], [place] - [address]

## 2020-10-12 ENCOUNTER — Other Ambulatory Visit: Payer: Self-pay | Admitting: Internal Medicine

## 2020-10-20 NOTE — Telephone Encounter (Signed)
   Patient calling, she has no medication remaining. Please advise

## 2020-10-21 NOTE — Telephone Encounter (Signed)
    Patient states on 10/25 she received a 30 day supply Calling for status of refill

## 2020-10-21 NOTE — Telephone Encounter (Signed)
Refill was sent on 10/20/20. See meds.

## 2020-10-22 ENCOUNTER — Encounter: Payer: Self-pay | Admitting: Internal Medicine

## 2020-11-20 ENCOUNTER — Encounter: Payer: BC Managed Care – PPO | Admitting: Internal Medicine

## 2020-11-26 ENCOUNTER — Encounter: Payer: BC Managed Care – PPO | Admitting: Internal Medicine

## 2020-12-19 ENCOUNTER — Ambulatory Visit (INDEPENDENT_AMBULATORY_CARE_PROVIDER_SITE_OTHER): Payer: BC Managed Care – PPO | Admitting: Internal Medicine

## 2020-12-19 ENCOUNTER — Other Ambulatory Visit: Payer: Self-pay

## 2020-12-19 ENCOUNTER — Encounter: Payer: Self-pay | Admitting: Internal Medicine

## 2020-12-19 VITALS — BP 124/64 | HR 98 | Temp 98.2°F | Resp 18 | Ht 67.0 in | Wt 153.4 lb

## 2020-12-19 DIAGNOSIS — F32A Depression, unspecified: Secondary | ICD-10-CM

## 2020-12-19 DIAGNOSIS — Z1211 Encounter for screening for malignant neoplasm of colon: Secondary | ICD-10-CM

## 2020-12-19 DIAGNOSIS — Z0000 Encounter for general adult medical examination without abnormal findings: Secondary | ICD-10-CM

## 2020-12-19 DIAGNOSIS — F419 Anxiety disorder, unspecified: Secondary | ICD-10-CM

## 2020-12-19 DIAGNOSIS — E538 Deficiency of other specified B group vitamins: Secondary | ICD-10-CM

## 2020-12-19 LAB — LIPID PANEL
Cholesterol: 236 mg/dL — ABNORMAL HIGH (ref 0–200)
HDL: 107.1 mg/dL (ref >39.00)
LDL Cholesterol: 116 mg/dL — ABNORMAL HIGH (ref 0–99)
NonHDL: 129.12
Total CHOL/HDL Ratio: 2
Triglycerides: 65 mg/dL (ref 0.0–149.0)
VLDL: 13 mg/dL (ref 0.0–40.0)

## 2020-12-19 LAB — COMPREHENSIVE METABOLIC PANEL
ALT: 27 U/L (ref 0–35)
AST: 26 U/L (ref 0–37)
Albumin: 4.4 g/dL (ref 3.5–5.2)
Alkaline Phosphatase: 61 U/L (ref 39–117)
BUN: 8 mg/dL (ref 6–23)
CO2: 30 mEq/L (ref 19–32)
Calcium: 9.7 mg/dL (ref 8.4–10.5)
Chloride: 101 mEq/L (ref 96–112)
Creatinine, Ser: 0.73 mg/dL (ref 0.40–1.20)
GFR: 87.44 mL/min (ref >60.00)
Glucose, Bld: 90 mg/dL (ref 70–99)
Potassium: 4 mEq/L (ref 3.5–5.1)
Sodium: 138 mEq/L (ref 135–145)
Total Bilirubin: 0.8 mg/dL (ref 0.2–1.2)
Total Protein: 7.3 g/dL (ref 6.0–8.3)

## 2020-12-19 LAB — CBC
HCT: 44.7 % (ref 36.0–46.0)
Hemoglobin: 15.2 g/dL — ABNORMAL HIGH (ref 12.0–15.0)
MCHC: 34.1 g/dL (ref 30.0–36.0)
MCV: 105 fl — ABNORMAL HIGH (ref 78.0–100.0)
Platelets: 276 10*3/uL (ref 150.0–400.0)
RBC: 4.26 Mil/uL (ref 3.87–5.11)
RDW: 12.1 % (ref 11.5–15.5)
WBC: 6 10*3/uL (ref 4.0–10.5)

## 2020-12-19 MED ORDER — BUPROPION HCL ER (XL) 150 MG PO TB24: 150.0000 mg | ORAL_TABLET | Freq: Every day | ORAL | 3 refills | Status: DC

## 2020-12-19 MED ORDER — "TUBERCULIN SYRINGE 25G X 5/8"" 1 ML MISC": 0 refills | Status: DC

## 2020-12-19 MED ORDER — CYANOCOBALAMIN 1000 MCG/ML IJ SOLN: INTRAMUSCULAR | 3 refills | Status: DC

## 2020-12-19 NOTE — Assessment & Plan Note (Signed)
Flu shot declines. Covid-19 up to date including booster. Shingrix complete. Tetanus up to date. Cologuard ordered today. Mammogram reminded of need to schedule, pap smear due 2023. Counseled about sun safety and mole surveillance. Counseled about the dangers of distracted driving. Given 10 year screening recommendations.

## 2020-12-19 NOTE — Patient Instructions (Signed)
Health Maintenance, Female Adopting a healthy lifestyle and getting preventive care are important in promoting health and wellness. Ask your health care provider about:  The right schedule for you to have regular tests and exams.  Things you can do on your own to prevent diseases and keep yourself healthy. What should I know about diet, weight, and exercise? Eat a healthy diet  Eat a diet that includes plenty of vegetables, fruits, low-fat dairy products, and lean protein.  Do not eat a lot of foods that are high in solid fats, added sugars, or sodium.   Maintain a healthy weight Body mass index (BMI) is used to identify weight problems. It estimates body fat based on height and weight. Your health care provider can help determine your BMI and help you achieve or maintain a healthy weight. Get regular exercise Get regular exercise. This is one of the most important things you can do for your health. Most adults should:  Exercise for at least 150 minutes each week. The exercise should increase your heart rate and make you sweat (moderate-intensity exercise).  Do strengthening exercises at least twice a week. This is in addition to the moderate-intensity exercise.  Spend less time sitting. Even light physical activity can be beneficial. Watch cholesterol and blood lipids Have your blood tested for lipids and cholesterol at 64 years of age, then have this test every 5 years. Have your cholesterol levels checked more often if:  Your lipid or cholesterol levels are high.  You are older than 64 years of age.  You are at high risk for heart disease. What should I know about cancer screening? Depending on your health history and family history, you may need to have cancer screening at various ages. This may include screening for:  Breast cancer.  Cervical cancer.  Colorectal cancer.  Skin cancer.  Lung cancer. What should I know about heart disease, diabetes, and high blood  pressure? Blood pressure and heart disease  High blood pressure causes heart disease and increases the risk of stroke. This is more likely to develop in people who have high blood pressure readings, are of African descent, or are overweight.  Have your blood pressure checked: ? Every 3-5 years if you are 18-39 years of age. ? Every year if you are 40 years old or older. Diabetes Have regular diabetes screenings. This checks your fasting blood sugar level. Have the screening done:  Once every three years after age 40 if you are at a normal weight and have a low risk for diabetes.  More often and at a younger age if you are overweight or have a high risk for diabetes. What should I know about preventing infection? Hepatitis B If you have a higher risk for hepatitis B, you should be screened for this virus. Talk with your health care provider to find out if you are at risk for hepatitis B infection. Hepatitis C Testing is recommended for:  Everyone born from 1945 through 1965.  Anyone with known risk factors for hepatitis C. Sexually transmitted infections (STIs)  Get screened for STIs, including gonorrhea and chlamydia, if: ? You are sexually active and are younger than 64 years of age. ? You are older than 64 years of age and your health care provider tells you that you are at risk for this type of infection. ? Your sexual activity has changed since you were last screened, and you are at increased risk for chlamydia or gonorrhea. Ask your health care provider   if you are at risk.  Ask your health care provider about whether you are at high risk for HIV. Your health care provider may recommend a prescription medicine to help prevent HIV infection. If you choose to take medicine to prevent HIV, you should first get tested for HIV. You should then be tested every 3 months for as long as you are taking the medicine. Pregnancy  If you are about to stop having your period (premenopausal) and  you may become pregnant, seek counseling before you get pregnant.  Take 400 to 800 micrograms (mcg) of folic acid every day if you become pregnant.  Ask for birth control (contraception) if you want to prevent pregnancy. Osteoporosis and menopause Osteoporosis is a disease in which the bones lose minerals and strength with aging. This can result in bone fractures. If you are 65 years old or older, or if you are at risk for osteoporosis and fractures, ask your health care provider if you should:  Be screened for bone loss.  Take a calcium or vitamin D supplement to lower your risk of fractures.  Be given hormone replacement therapy (HRT) to treat symptoms of menopause. Follow these instructions at home: Lifestyle  Do not use any products that contain nicotine or tobacco, such as cigarettes, e-cigarettes, and chewing tobacco. If you need help quitting, ask your health care provider.  Do not use street drugs.  Do not share needles.  Ask your health care provider for help if you need support or information about quitting drugs. Alcohol use  Do not drink alcohol if: ? Your health care provider tells you not to drink. ? You are pregnant, may be pregnant, or are planning to become pregnant.  If you drink alcohol: ? Limit how much you use to 0-1 drink a day. ? Limit intake if you are breastfeeding.  Be aware of how much alcohol is in your drink. In the [place] one drink equals one 12 oz bottle of beer (355 mL), one 5 oz glass of wine (148 mL), or one 1 oz glass of hard liquor (44 mL). General instructions  Schedule regular health, dental, and eye exams.  Stay current with your vaccines.  Tell your health care provider if: ? You often feel depressed. ? You have ever been abused or do not feel safe at home. Summary  Adopting a healthy lifestyle and getting preventive care are important in promoting health and wellness.  Follow your health care provider's instructions about healthy  diet, exercising, and getting tested or screened for diseases.  Follow your health care provider's instructions on monitoring your cholesterol and blood pressure. This information is not intended to replace advice given to you by your health care provider. Make sure you discuss any questions you have with your health care provider. Document Revised: 11/01/2018 Document Reviewed: 11/01/2018 Elsevier Patient Education  2021 Elsevier Inc.  

## 2020-12-19 NOTE — Progress Notes (Signed)
   Subjective:   Patient ID: Teresa Monroe, female    DOB: [birth year], 65 y.o.   MRN: [id number]  HPI The patient is a 65 YO female coming in for physical.   PMH, FMH, social history reviewed and updated  Review of Systems  Constitutional: Negative.   HENT: Negative.   Eyes: Negative.   Respiratory: Negative for cough, chest tightness and shortness of breath.   Cardiovascular: Negative for chest pain, palpitations and leg swelling.  Gastrointestinal: Negative for abdominal distention, abdominal pain, constipation, diarrhea, nausea and vomiting.  Musculoskeletal: Negative.   Skin: Negative.   Neurological: Negative.   Psychiatric/Behavioral: Negative.     Objective:  Physical Exam Constitutional:      Appearance: She is well-developed and well-nourished.  HENT:     Head: Normocephalic and atraumatic.  Eyes:     Extraocular Movements: EOM normal.  Cardiovascular:     Rate and Rhythm: Normal rate and regular rhythm.  Pulmonary:     Effort: Pulmonary effort is normal. No respiratory distress.     Breath sounds: Normal breath sounds. No wheezing or rales.  Abdominal:     General: Bowel sounds are normal. There is no distension.     Palpations: Abdomen is soft.     Tenderness: There is no abdominal tenderness. There is no rebound.  Musculoskeletal:        General: No edema.     Cervical back: Normal range of motion.  Skin:    General: Skin is warm and dry.  Neurological:     Mental Status: She is alert and oriented to person, place, and time.     Coordination: Coordination normal.  Psychiatric:        Mood and Affect: Mood and affect normal.     Vitals:   12/19/20 1113  BP: 124/64  Pulse: 98  Resp: 18  Temp: 98.2 F (36.8 C)  TempSrc: Oral  SpO2: 98%  Weight: 153 lb 6.4 oz (69.6 kg)  Height: 5\' 7"  (1.702 m)    This visit occurred during the SARS-CoV-2 public health emergency.  Safety protocols were in place, including screening questions prior to the visit,  additional usage of staff PPE, and extensive cleaning of exam room while observing appropriate contact time as indicated for disinfecting solutions.   Assessment & Plan:

## 2020-12-19 NOTE — Assessment & Plan Note (Signed)
Doing well overall, refilled wellbutrin 150 mg daily.

## 2020-12-19 NOTE — Assessment & Plan Note (Signed)
Refilled B12 and syringes to use at home monthly.

## 2021-12-22 ENCOUNTER — Ambulatory Visit (INDEPENDENT_AMBULATORY_CARE_PROVIDER_SITE_OTHER): Payer: BC Managed Care – PPO | Admitting: Internal Medicine

## 2021-12-22 ENCOUNTER — Encounter: Payer: Self-pay | Admitting: Internal Medicine

## 2021-12-22 ENCOUNTER — Other Ambulatory Visit: Payer: Self-pay

## 2021-12-22 VITALS — BP 118/82 | HR 85 | Resp 18 | Ht 67.0 in | Wt 162.0 lb

## 2021-12-22 DIAGNOSIS — Z1211 Encounter for screening for malignant neoplasm of colon: Secondary | ICD-10-CM | POA: Diagnosis not present

## 2021-12-22 DIAGNOSIS — Q231 Congenital insufficiency of aortic valve: Secondary | ICD-10-CM

## 2021-12-22 DIAGNOSIS — Z1231 Encounter for screening mammogram for malignant neoplasm of breast: Secondary | ICD-10-CM | POA: Diagnosis not present

## 2021-12-22 DIAGNOSIS — E538 Deficiency of other specified B group vitamins: Secondary | ICD-10-CM | POA: Diagnosis not present

## 2021-12-22 DIAGNOSIS — Z0000 Encounter for general adult medical examination without abnormal findings: Secondary | ICD-10-CM | POA: Diagnosis not present

## 2021-12-22 DIAGNOSIS — Z87891 Personal history of nicotine dependence: Secondary | ICD-10-CM

## 2021-12-22 DIAGNOSIS — F32A Depression, unspecified: Secondary | ICD-10-CM

## 2021-12-22 DIAGNOSIS — F419 Anxiety disorder, unspecified: Secondary | ICD-10-CM

## 2021-12-22 LAB — COMPREHENSIVE METABOLIC PANEL
ALT: 20 U/L (ref 0–35)
AST: 20 U/L (ref 0–37)
Albumin: 4.1 g/dL (ref 3.5–5.2)
Alkaline Phosphatase: 54 U/L (ref 39–117)
BUN: 10 mg/dL (ref 6–23)
CO2: 29 mEq/L (ref 19–32)
Calcium: 9.2 mg/dL (ref 8.4–10.5)
Chloride: 102 mEq/L (ref 96–112)
Creatinine, Ser: 0.73 mg/dL (ref 0.40–1.20)
GFR: 86.82 mL/min (ref >60.00)
Glucose, Bld: 81 mg/dL (ref 70–99)
Potassium: 4 mEq/L (ref 3.5–5.1)
Sodium: 138 mEq/L (ref 135–145)
Total Bilirubin: 0.7 mg/dL (ref 0.2–1.2)
Total Protein: 7 g/dL (ref 6.0–8.3)

## 2021-12-22 LAB — CBC
HCT: 41.1 % (ref 36.0–46.0)
Hemoglobin: 14.1 g/dL (ref 12.0–15.0)
MCHC: 34.2 g/dL (ref 30.0–36.0)
MCV: 101.6 fl — ABNORMAL HIGH (ref 78.0–100.0)
Platelets: 284 10*3/uL (ref 150.0–400.0)
RBC: 4.05 Mil/uL (ref 3.87–5.11)
RDW: 12.1 % (ref 11.5–15.5)
WBC: 6.6 10*3/uL (ref 4.0–10.5)

## 2021-12-22 LAB — LIPID PANEL
Cholesterol: 240 mg/dL — ABNORMAL HIGH (ref 0–200)
HDL: 89.9 mg/dL (ref >39.00)
LDL Cholesterol: 124 mg/dL — ABNORMAL HIGH (ref 0–99)
NonHDL: 149.96
Total CHOL/HDL Ratio: 3
Triglycerides: 132 mg/dL (ref 0.0–149.0)
VLDL: 26.4 mg/dL (ref 0.0–40.0)

## 2021-12-22 LAB — VITAMIN B12: Vitamin B-12: 365 pg/mL (ref 211–911)

## 2021-12-22 MED ORDER — CYANOCOBALAMIN 1000 MCG/ML IJ SOLN: INTRAMUSCULAR | 3 refills | Status: DC

## 2021-12-22 MED ORDER — BUPROPION HCL ER (XL) 150 MG PO TB24: 150.0000 mg | ORAL_TABLET | Freq: Every day | ORAL | 3 refills | Status: DC

## 2021-12-22 NOTE — Assessment & Plan Note (Signed)
Flu shot declines. Covid-19 up to date. Shingrix complete. Tetanus up to date. Cologuard ordered. Mammogram ordered, pap smear due this fall. Counseled about sun safety and mole surveillance. Counseled about the dangers of distracted driving. Given 10 year screening recommendations.

## 2021-12-22 NOTE — Assessment & Plan Note (Signed)
Still not smoking almost 3 years and congratulated and encouraged to maintain lifelong cessation.

## 2021-12-22 NOTE — Assessment & Plan Note (Signed)
Reviewed prior echo and plan for echo repeat 2024 for evaluation.

## 2021-12-22 NOTE — Assessment & Plan Note (Signed)
Refilled home B12 injections. Checking B12 level today.

## 2021-12-22 NOTE — Assessment & Plan Note (Signed)
Doing well and will continue wellbutrin 150 mg daily. Refilled.

## 2021-12-22 NOTE — Patient Instructions (Signed)
We will send the cologuard and get the mammogram done.

## 2021-12-22 NOTE — Progress Notes (Signed)
° °  Subjective:   Patient ID: Teresa Monroe, female    DOB: 04-Sep-1956, 66 y.o.   MRN: [id number]  HPI The patient is a 66 YO female coming in for physical.   PMH, FMH, social history reviewed and updated  Review of Systems  Constitutional: Negative.   HENT: Negative.    Eyes: Negative.   Respiratory:  Negative for cough, chest tightness and shortness of breath.   Cardiovascular:  Negative for chest pain, palpitations and leg swelling.  Gastrointestinal:  Negative for abdominal distention, abdominal pain, constipation, diarrhea, nausea and vomiting.  Musculoskeletal: Negative.   Skin: Negative.   Neurological: Negative.   Psychiatric/Behavioral: Negative.     Objective:  Physical Exam Constitutional:      Appearance: She is well-developed.  HENT:     Head: Normocephalic and atraumatic.  Cardiovascular:     Rate and Rhythm: Normal rate and regular rhythm.  Pulmonary:     Effort: Pulmonary effort is normal. No respiratory distress.     Breath sounds: Normal breath sounds. No wheezing or rales.  Abdominal:     General: Bowel sounds are normal. There is no distension.     Palpations: Abdomen is soft.     Tenderness: There is no abdominal tenderness. There is no rebound.  Musculoskeletal:     Cervical back: Normal range of motion.  Skin:    General: Skin is warm and dry.  Neurological:     Mental Status: She is alert and oriented to person, place, and time.     Coordination: Coordination normal.    Vitals:   12/22/21 0847  BP: 118/82  Pulse: 85  Resp: 18  SpO2: 98%  Weight: 162 lb (73.5 kg)  Height: 5\' 7"  (1.702 m)    This visit occurred during the SARS-CoV-2 public health emergency.  Safety protocols were in place, including screening questions prior to the visit, additional usage of staff PPE, and extensive cleaning of exam room while observing appropriate contact time as indicated for disinfecting solutions.   Assessment & Plan:

## 2022-01-04 ENCOUNTER — Encounter: Payer: Self-pay | Admitting: Internal Medicine

## 2022-01-20 ENCOUNTER — Encounter: Payer: Self-pay | Admitting: Internal Medicine

## 2022-01-20 ENCOUNTER — Other Ambulatory Visit: Payer: Self-pay

## 2022-01-20 ENCOUNTER — Ambulatory Visit (INDEPENDENT_AMBULATORY_CARE_PROVIDER_SITE_OTHER): Payer: BC Managed Care – PPO | Admitting: Internal Medicine

## 2022-01-20 VITALS — BP 122/70 | HR 93 | Resp 18 | Ht 67.0 in | Wt 153.6 lb

## 2022-01-20 DIAGNOSIS — R197 Diarrhea, unspecified: Secondary | ICD-10-CM

## 2022-01-20 NOTE — Progress Notes (Signed)
? ?  Subjective:  ? ?Patient ID: Teresa Monroe, female    DOB: Apr 27, 1956, 66 y.o.   MRN: [id number] ? ?HPI ?The patient is a 66 YO female coming in for diarrhea. ? ?Review of Systems  ?Constitutional: Negative.   ?HENT: Negative.    ?Eyes: Negative.   ?Respiratory:  Negative for cough, chest tightness and shortness of breath.   ?Cardiovascular:  Negative for chest pain, palpitations and leg swelling.  ?Gastrointestinal:  Positive for abdominal pain and diarrhea. Negative for abdominal distention, constipation, nausea and vomiting.  ?Musculoskeletal: Negative.   ?Skin: Negative.   ?Neurological: Negative.   ?Psychiatric/Behavioral: Negative.    ? ?Objective:  ?Physical Exam ?Constitutional:   ?   Appearance: She is well-developed.  ?HENT:  ?   Head: Normocephalic and atraumatic.  ?Cardiovascular:  ?   Rate and Rhythm: Normal rate and regular rhythm.  ?Pulmonary:  ?   Effort: Pulmonary effort is normal. No respiratory distress.  ?   Breath sounds: Normal breath sounds. No wheezing or rales.  ?Abdominal:  ?   General: Bowel sounds are normal. There is no distension.  ?   Palpations: Abdomen is soft.  ?   Tenderness: There is no abdominal tenderness. There is no rebound.  ?Musculoskeletal:  ?   Cervical back: Normal range of motion.  ?Skin: ?   General: Skin is warm and dry.  ?Neurological:  ?   Mental Status: She is alert and oriented to person, place, and time.  ?   Coordination: Coordination normal.  ? ? ?Vitals:  ? 01/20/22 0953  ?BP: 122/70  ?Pulse: 93  ?Resp: 18  ?SpO2: 98%  ?Weight: 153 lb 9.6 oz (69.7 kg)  ?Height: 5\' 7"  (1.702 m)  ? ? ?This visit occurred during the SARS-CoV-2 public health emergency.  Safety protocols were in place, including screening questions prior to the visit, additional usage of staff PPE, and extensive cleaning of exam room while observing appropriate contact time as indicated for disinfecting solutions.  ? ?Assessment & Plan:  ? ?

## 2022-01-20 NOTE — Patient Instructions (Signed)
We have ordered the test and try a probiotic. ?

## 2022-01-21 DIAGNOSIS — R197 Diarrhea, unspecified: Secondary | ICD-10-CM | POA: Insufficient documentation

## 2022-01-21 NOTE — Assessment & Plan Note (Signed)
2 episodes of GI "virus" in the last month. Last diarrhea about 1-2 days ago. Will order GI profile to check for infection and if diarrhea recurrent she will collect for testing. No high risk factors to suggest C dif (no recent antibiotics or hospital exposure). Advised BRAT diet and add probiotics otc to help with lingering GI symptoms. ?

## 2022-11-10 ENCOUNTER — Encounter: Payer: Self-pay | Admitting: Internal Medicine

## 2022-12-23 ENCOUNTER — Ambulatory Visit (INDEPENDENT_AMBULATORY_CARE_PROVIDER_SITE_OTHER): Payer: BC Managed Care – PPO | Admitting: Internal Medicine

## 2022-12-23 ENCOUNTER — Encounter: Payer: Self-pay | Admitting: Internal Medicine

## 2022-12-23 VITALS — BP 140/80 | HR 102 | Temp 97.5°F | Ht 67.0 in | Wt 140.2 lb

## 2022-12-23 DIAGNOSIS — E782 Mixed hyperlipidemia: Secondary | ICD-10-CM | POA: Diagnosis not present

## 2022-12-23 DIAGNOSIS — Z1231 Encounter for screening mammogram for malignant neoplasm of breast: Secondary | ICD-10-CM | POA: Diagnosis not present

## 2022-12-23 DIAGNOSIS — Z1211 Encounter for screening for malignant neoplasm of colon: Secondary | ICD-10-CM | POA: Diagnosis not present

## 2022-12-23 DIAGNOSIS — F32A Depression, unspecified: Secondary | ICD-10-CM

## 2022-12-23 DIAGNOSIS — E538 Deficiency of other specified B group vitamins: Secondary | ICD-10-CM

## 2022-12-23 DIAGNOSIS — F419 Anxiety disorder, unspecified: Secondary | ICD-10-CM

## 2022-12-23 DIAGNOSIS — Z0000 Encounter for general adult medical examination without abnormal findings: Secondary | ICD-10-CM

## 2022-12-23 LAB — COMPREHENSIVE METABOLIC PANEL
ALT: 17 U/L (ref 0–35)
AST: 22 U/L (ref 0–37)
Albumin: 4.5 g/dL (ref 3.5–5.2)
Alkaline Phosphatase: 52 U/L (ref 39–117)
BUN: 6 mg/dL (ref 6–23)
CO2: 29 mEq/L (ref 19–32)
Calcium: 9.5 mg/dL (ref 8.4–10.5)
Chloride: 100 mEq/L (ref 96–112)
Creatinine, Ser: 0.72 mg/dL (ref 0.40–1.20)
GFR: 87.65 mL/min (ref >60.00)
Glucose, Bld: 85 mg/dL (ref 70–99)
Potassium: 4.2 mEq/L (ref 3.5–5.1)
Sodium: 137 mEq/L (ref 135–145)
Total Bilirubin: 0.8 mg/dL (ref 0.2–1.2)
Total Protein: 7.4 g/dL (ref 6.0–8.3)

## 2022-12-23 LAB — LIPID PANEL
Cholesterol: 281 mg/dL — ABNORMAL HIGH (ref 0–200)
HDL: 105.8 mg/dL (ref >39.00)
LDL Cholesterol: 157 mg/dL — ABNORMAL HIGH (ref 0–99)
NonHDL: 175.46
Total CHOL/HDL Ratio: 3
Triglycerides: 93 mg/dL (ref 0.0–149.0)
VLDL: 18.6 mg/dL (ref 0.0–40.0)

## 2022-12-23 LAB — CBC
HCT: 43.1 % (ref 36.0–46.0)
Hemoglobin: 15.1 g/dL — ABNORMAL HIGH (ref 12.0–15.0)
MCHC: 35.1 g/dL (ref 30.0–36.0)
MCV: 102.8 fl — ABNORMAL HIGH (ref 78.0–100.0)
Platelets: 291 10*3/uL (ref 150.0–400.0)
RBC: 4.19 Mil/uL (ref 3.87–5.11)
RDW: 12.5 % (ref 11.5–15.5)
WBC: 5.8 10*3/uL (ref 4.0–10.5)

## 2022-12-23 MED ORDER — BUPROPION HCL ER (XL) 150 MG PO TB24: 150.0000 mg | ORAL_TABLET | Freq: Every day | ORAL | 3 refills | Status: DC

## 2022-12-23 NOTE — Progress Notes (Signed)
   Subjective:   Patient ID: Teresa Monroe, female    DOB: November 16, 1956, 67 y.o.   MRN: [id number]  HPI The patient is here for physical.  PMH, [facility], social history reviewed and updated  Review of Systems  Constitutional: Negative.   HENT: Negative.    Eyes: Negative.   Respiratory:  Negative for cough, chest tightness and shortness of breath.   Cardiovascular:  Negative for chest pain, palpitations and leg swelling.  Gastrointestinal:  Negative for abdominal distention, abdominal pain, constipation, diarrhea, nausea and vomiting.  Musculoskeletal: Negative.   Skin: Negative.   Neurological: Negative.   Psychiatric/Behavioral: Negative.      Objective:  Physical Exam Constitutional:      Appearance: She is well-developed.  HENT:     Head: Normocephalic and atraumatic.  Cardiovascular:     Rate and Rhythm: Normal rate and regular rhythm.  Pulmonary:     Effort: Pulmonary effort is normal. No respiratory distress.     Breath sounds: Normal breath sounds. No wheezing or rales.  Abdominal:     General: Bowel sounds are normal. There is no distension.     Palpations: Abdomen is soft.     Tenderness: There is no abdominal tenderness. There is no rebound.  Musculoskeletal:     Cervical back: Normal range of motion.  Skin:    General: Skin is warm and dry.  Neurological:     Mental Status: She is alert and oriented to person, place, and time.     Coordination: Coordination normal.     Vitals:   12/23/22 0820 12/23/22 0826  BP: (!) 140/80 (!) 140/80  Pulse: (!) 102   Temp: (!) 97.5 F (36.4 C)   TempSrc: Oral   SpO2: 98%   Weight: 140 lb 4 oz (63.6 kg)   Height: 5\' 7"  (1.702 m)     Assessment & Plan:

## 2022-12-24 ENCOUNTER — Encounter: Payer: Self-pay | Admitting: Internal Medicine

## 2022-12-24 NOTE — Assessment & Plan Note (Signed)
Does home injections monthly and will continue lifelong. No symptoms of deficiency at this time will not check levels.

## 2022-12-24 NOTE — Assessment & Plan Note (Addendum)
Flu shot declines. Covid-19. Pneumonia declines. Shingrix complete. Tetanus up to date. Cologuard ordered. Mammogram due ordered, pap smear due declines and dexa due next year. Counseled about sun safety and mole surveillance. Counseled about the dangers of distracted driving. Given 10 year screening recommendations.

## 2022-12-24 NOTE — Assessment & Plan Note (Signed)
Wellbutrin is doing well. Symptoms controlled. She does not wish to try off. Continue wellbutrin 150 mg daily and refilled at visit.

## 2023-12-13 ENCOUNTER — Encounter: Payer: Self-pay | Admitting: Internal Medicine

## 2023-12-23 ENCOUNTER — Encounter: Payer: BC Managed Care – PPO | Admitting: Internal Medicine

## 2023-12-26 LAB — COLOGUARD: COLOGUARD: NEGATIVE

## 2023-12-27 ENCOUNTER — Encounter: Payer: Self-pay | Admitting: Internal Medicine

## 2023-12-27 ENCOUNTER — Ambulatory Visit (INDEPENDENT_AMBULATORY_CARE_PROVIDER_SITE_OTHER): Payer: BC Managed Care – PPO | Admitting: Internal Medicine

## 2023-12-27 VITALS — BP 132/86 | HR 66 | Temp 98.2°F | Ht 67.0 in | Wt 129.0 lb

## 2023-12-27 DIAGNOSIS — F32A Depression, unspecified: Secondary | ICD-10-CM

## 2023-12-27 DIAGNOSIS — Z1231 Encounter for screening mammogram for malignant neoplasm of breast: Secondary | ICD-10-CM

## 2023-12-27 DIAGNOSIS — E2839 Other primary ovarian failure: Secondary | ICD-10-CM

## 2023-12-27 DIAGNOSIS — Z23 Encounter for immunization: Secondary | ICD-10-CM | POA: Diagnosis not present

## 2023-12-27 DIAGNOSIS — E538 Deficiency of other specified B group vitamins: Secondary | ICD-10-CM

## 2023-12-27 DIAGNOSIS — Z0000 Encounter for general adult medical examination without abnormal findings: Secondary | ICD-10-CM | POA: Diagnosis not present

## 2023-12-27 DIAGNOSIS — Q2381 Bicuspid aortic valve: Secondary | ICD-10-CM

## 2023-12-27 DIAGNOSIS — F419 Anxiety disorder, unspecified: Secondary | ICD-10-CM | POA: Diagnosis not present

## 2023-12-27 LAB — COMPREHENSIVE METABOLIC PANEL
ALT: 23 U/L (ref 0–35)
AST: 28 U/L (ref 0–37)
Albumin: 4.1 g/dL (ref 3.5–5.2)
Alkaline Phosphatase: 69 U/L (ref 39–117)
BUN: 11 mg/dL (ref 6–23)
CO2: 29 meq/L (ref 19–32)
Calcium: 9 mg/dL (ref 8.4–10.5)
Chloride: 100 meq/L (ref 96–112)
Creatinine, Ser: 0.72 mg/dL (ref 0.40–1.20)
GFR: 87.03 mL/min (ref >60.00)
Glucose, Bld: 87 mg/dL (ref 70–99)
Potassium: 4.2 meq/L (ref 3.5–5.1)
Sodium: 139 meq/L (ref 135–145)
Total Bilirubin: 0.8 mg/dL (ref 0.2–1.2)
Total Protein: 6.9 g/dL (ref 6.0–8.3)

## 2023-12-27 LAB — CBC
HCT: 41.5 % (ref 36.0–46.0)
Hemoglobin: 14.1 g/dL (ref 12.0–15.0)
MCHC: 33.9 g/dL (ref 30.0–36.0)
MCV: 107.4 fL — ABNORMAL HIGH (ref 78.0–100.0)
Platelets: 304 10*3/uL (ref 150.0–400.0)
RBC: 3.86 Mil/uL — ABNORMAL LOW (ref 3.87–5.11)
RDW: 12.5 % (ref 11.5–15.5)
WBC: 7.8 10*3/uL (ref 4.0–10.5)

## 2023-12-27 LAB — COLOGUARD: Cologuard: NEGATIVE

## 2023-12-27 LAB — LIPID PANEL
Cholesterol: 221 mg/dL — ABNORMAL HIGH (ref 0–200)
HDL: 105.2 mg/dL (ref >39.00)
LDL Cholesterol: 104 mg/dL — ABNORMAL HIGH (ref 0–99)
NonHDL: 115.93
Total CHOL/HDL Ratio: 2
Triglycerides: 62 mg/dL (ref 0.0–149.0)
VLDL: 12.4 mg/dL (ref 0.0–40.0)

## 2023-12-27 MED ORDER — TUBERCULIN SYRINGE 25G X 5/8" 1 ML MISC: 0 refills | Status: DC

## 2023-12-27 MED ORDER — CYANOCOBALAMIN 1000 MCG/ML IJ SOLN: INTRAMUSCULAR | 3 refills | Status: AC

## 2023-12-27 MED ORDER — BUPROPION HCL ER (XL) 150 MG PO TB24: 150.0000 mg | ORAL_TABLET | Freq: Every day | ORAL | 3 refills | Status: DC

## 2023-12-27 MED ORDER — CICLOPIROX 8 % EX SOLN: Freq: Every day | CUTANEOUS | 3 refills | Status: DC

## 2023-12-27 NOTE — Assessment & Plan Note (Signed)
Will repeat echo 2026 no symptoms.

## 2023-12-27 NOTE — Progress Notes (Signed)
   Subjective:   Patient ID: Teresa Monroe, female    DOB: 12-31-1955, 68 y.o.   MRN: [id number]  HPI The patient is here for physical.   PMH, [facility], social history reviewed and updated  Review of Systems  Constitutional: Negative.   HENT: Negative.    Eyes: Negative.   Respiratory:  Negative for cough, chest tightness and shortness of breath.   Cardiovascular:  Negative for chest pain, palpitations and leg swelling.  Gastrointestinal:  Negative for abdominal distention, abdominal pain, constipation, diarrhea, nausea and vomiting.  Musculoskeletal: Negative.   Skin: Negative.   Neurological: Negative.   Psychiatric/Behavioral: Negative.      Objective:  Physical Exam Constitutional:      Appearance: She is well-developed.  HENT:     Head: Normocephalic and atraumatic.  Cardiovascular:     Rate and Rhythm: Normal rate and regular rhythm.  Pulmonary:     Effort: Pulmonary effort is normal. No respiratory distress.     Breath sounds: Normal breath sounds. No wheezing or rales.  Abdominal:     General: Bowel sounds are normal. There is no distension.     Palpations: Abdomen is soft.     Tenderness: There is no abdominal tenderness. There is no rebound.  Musculoskeletal:     Cervical back: Normal range of motion.  Skin:    General: Skin is warm and dry.  Neurological:     Mental Status: She is alert and oriented to person, place, and time.     Coordination: Coordination normal.     Vitals:   12/27/23 0922  BP: 132/86  Pulse: 66  Temp: 98.2 F (36.8 C)  TempSrc: Oral  SpO2: 98%  Weight: 129 lb (58.5 kg)  Height: 5' 7 (1.702 m)    Assessment & Plan:  Prevnar 20 given at visit

## 2023-12-27 NOTE — Assessment & Plan Note (Signed)
Taking wellbutrin 150 mg daily. Checking lipid panel and CMP and CBC and adjust as needed.

## 2023-12-27 NOTE — Assessment & Plan Note (Signed)
Refill B12 shots which she does monthly.

## 2023-12-27 NOTE — Assessment & Plan Note (Signed)
 Flu shot declines. Pneumonia 20 given. Shingrix  complete. Tetanus up to date. Cologuard up to date. Mammogram due ordered, pap smear aged out and dexa due ordered. Counseled about sun safety and mole surveillance. Counseled about the dangers of distracted driving. Given 10 year screening recommendations.

## 2023-12-28 ENCOUNTER — Encounter: Payer: Self-pay | Admitting: Internal Medicine

## 2024-01-19 ENCOUNTER — Ambulatory Visit
Admission: RE | Admit: 2024-01-19 | Discharge: 2024-01-19 | Disposition: A | Discharge status: Home / Self Care | Payer: BC Managed Care – PPO | Source: Ambulatory Visit | Attending: Internal Medicine | Admitting: Internal Medicine

## 2024-01-19 DIAGNOSIS — E2839 Other primary ovarian failure: Secondary | ICD-10-CM

## 2024-01-23 ENCOUNTER — Encounter: Payer: Self-pay | Admitting: Internal Medicine

## 2024-01-25 ENCOUNTER — Encounter: Payer: Self-pay | Admitting: Internal Medicine

## 2024-01-25 LAB — HM DEXA SCAN: HM Dexa Scan: -0.8

## 2024-01-31 ENCOUNTER — Encounter: Payer: Self-pay | Admitting: Internal Medicine

## 2024-02-01 NOTE — Telephone Encounter (Signed)
 Can patient do a virtual?

## 2024-02-08 ENCOUNTER — Ambulatory Visit
Admission: RE | Admit: 2024-02-08 | Discharge: 2024-02-08 | Disposition: A | Discharge status: Home / Self Care | Payer: BC Managed Care – PPO | Source: Ambulatory Visit | Attending: Internal Medicine | Admitting: Internal Medicine

## 2024-02-08 DIAGNOSIS — Z1231 Encounter for screening mammogram for malignant neoplasm of breast: Secondary | ICD-10-CM

## 2024-02-13 ENCOUNTER — Encounter: Payer: Self-pay | Admitting: Internal Medicine

## 2024-02-15 ENCOUNTER — Encounter: Payer: Self-pay | Admitting: Internal Medicine

## 2024-02-15 ENCOUNTER — Telehealth (INDEPENDENT_AMBULATORY_CARE_PROVIDER_SITE_OTHER): Admitting: Internal Medicine

## 2024-02-15 DIAGNOSIS — R0683 Snoring: Secondary | ICD-10-CM

## 2024-02-15 NOTE — Assessment & Plan Note (Signed)
 Sleepy during the daytime and concentration change with snoring at night time. Will order home sleep study to assess.

## 2024-02-15 NOTE — Progress Notes (Signed)
 Virtual Visit via Video Note  I connected with Teresa Monroe on 02/15/24 at  8:40 AM EDT by a video enabled telemedicine application and verified that I am speaking with the correct person using two identifiers.  The patient and the provider were at separate locations throughout the entire encounter. Patient location: home, Provider location: work   I discussed the limitations of evaluation and management by telemedicine and the availability of in person appointments. The patient expressed understanding and agreed to proceed. The patient and the provider were the only parties present for the visit unless noted in HPI below.  History of Present Illness: The patient is a 68 y.o. female with visit for snoring and daytime sleepiness. Some concentration deficit. Would like sleep test. Also wondering if she should get MMR booster  Observations/Objective: Appearance: normal, breathing appears normal, casual grooming, mental status is A and O times 3  Assessment and Plan: See problem oriented charting  Follow Up Instructions: order home sleep study and treat as appropriate.   I discussed the assessment and treatment plan with the patient. The patient was provided an opportunity to ask questions and all were answered. The patient agreed with the plan and demonstrated an understanding of the instructions.   The patient was advised to call back or seek an in-person evaluation if the symptoms worsen or if the condition fails to improve as anticipated.  Myrlene Broker, MD

## 2024-03-30 ENCOUNTER — Telehealth: Payer: Self-pay | Admitting: Internal Medicine

## 2024-03-30 NOTE — Telephone Encounter (Signed)
 Copied from CRM [id number]. Topic: Referral - Prior Authorization Question >> Mar 30, 2024  2:38 PM Clyde Darling P wrote: Reason for CRM: [facility] for sleep study called to advise pt insurance is requesting more information- PCP or Nurse can contact [employer] [phone] CASE NUMBER [id number] Would need to be completed within 24 hours ( Monday by 2:30pm) - also advise after completion to contact [phone] so they can know how to proceed

## 2024-06-29 ENCOUNTER — Encounter: Payer: Self-pay | Admitting: Internal Medicine

## 2024-06-29 ENCOUNTER — Encounter

## 2024-07-06 ENCOUNTER — Encounter: Payer: Self-pay | Admitting: Internal Medicine

## 2024-07-06 ENCOUNTER — Telehealth (INDEPENDENT_AMBULATORY_CARE_PROVIDER_SITE_OTHER): Admitting: Internal Medicine

## 2024-07-06 DIAGNOSIS — R1011 Right upper quadrant pain: Secondary | ICD-10-CM | POA: Diagnosis not present

## 2024-07-06 NOTE — Progress Notes (Signed)
 Virtual Visit via Video Note  I connected with Teresa Monroe on 07/06/24 at  9:40 AM EDT by a video enabled telemedicine application and verified that I am speaking with the correct person using two identifiers.  The patient and the provider were at separate locations throughout the entire encounter. Patient location: home, Provider location: work   I discussed the limitations of evaluation and management by telemedicine and the availability of in person appointments. The patient expressed understanding and agreed to proceed. The patient and the provider were the only parties present for the visit unless noted in HPI below.  History of Present Illness: Discussed the use of AI scribe software for clinical note transcription with the patient, who gave verbal consent to proceed.  History of Present Illness Teresa Monroe is a 68 year old female who presents with abdominal pain and bloating.  She experiences abdominal pain and bloating, particularly after consuming red meat. The pain is described as a tight sensation in the stomach, with bloating and discomfort around the rib cage, primarily on the right side. The pain is severe enough to disrupt sleep, as it did last week.  She manages her symptoms by avoiding red meat and consuming chicken, mashed potatoes, and tomatoes, which has resulted in some improvement. The pain is less sharp and more manageable with these dietary changes. Over-the-counter medications such as Mylanta and generic Nexium, taken daily, provide eventual relief, though it takes a long time for symptoms to subside.  She experiences nausea but no vomiting. Diarrhea occurs once she starts feeling better, but otherwise, her bowel movements are normal. No changes in bowel habits, blood in stool, or constipation are reported.  There is a family history of gallbladder issues, as her father also had his gallbladder removed. Observations/Objective: Appearance: normal, breathing  appears normal, casual grooming, abdomen does not appear distended, throat normal, memory normal, mental status is A and O times 3  Assessment and Plan Assessment & Plan Right upper abdominal pain and bloating   Right upper abdominal pain and bloating likely stem from gallbladder issues, possibly stones or sludge. If gallbladder issues are excluded, consider gastritis or ulcers. No infection or fever present. Family history of gallbladder issues noted. Gallbladder removal discussed as a potential treatment. Order an urgent ultrasound of the liver and gallbladder to check for stones or sludge. If the ultrasound is normal, refer to GI for endoscopy to evaluate for gastritis or ulcers. Schedule the ultrasound after her travel.  Follow Up Instructions: order US  RUQ  I discussed the assessment and treatment plan with the patient. The patient was provided an opportunity to ask questions and all were answered. The patient agreed with the plan and demonstrated an understanding of the instructions.   The patient was advised to call back or seek an in-person evaluation if the symptoms worsen or if the condition fails to improve as anticipated.  Almarie DELENA Cleveland, MD

## 2024-07-06 NOTE — Assessment & Plan Note (Signed)
 Suspect gallbladder related as she is on daily ppi without relief. Ordered US  RUQ and if negative will refer to GI for likely EGD.

## 2024-07-11 ENCOUNTER — Ambulatory Visit: Admitting: Internal Medicine

## 2024-07-11 ENCOUNTER — Encounter: Payer: Self-pay | Admitting: Internal Medicine

## 2024-07-11 ENCOUNTER — Inpatient Hospital Stay
Admission: RE | Admit: 2024-07-11 | Discharge: 2024-07-11 | Discharge status: Home / Self Care | Source: Ambulatory Visit | Attending: Internal Medicine | Admitting: Internal Medicine

## 2024-07-11 DIAGNOSIS — R1011 Right upper quadrant pain: Secondary | ICD-10-CM

## 2024-07-12 ENCOUNTER — Ambulatory Visit: Payer: Self-pay | Admitting: Internal Medicine

## 2024-07-12 DIAGNOSIS — K8020 Calculus of gallbladder without cholecystitis without obstruction: Secondary | ICD-10-CM

## 2024-07-12 DIAGNOSIS — R1011 Right upper quadrant pain: Secondary | ICD-10-CM

## 2024-07-13 MED ORDER — PRAVASTATIN SODIUM 20 MG PO TABS: 20.0000 mg | ORAL_TABLET | Freq: Every day | ORAL | 3 refills | Status: DC

## 2024-07-13 NOTE — Telephone Encounter (Signed)
 I have sent you this response in results note I believe.

## 2024-07-27 ENCOUNTER — Other Ambulatory Visit: Payer: Self-pay

## 2024-07-27 MED ORDER — PRAVASTATIN SODIUM 20 MG PO TABS: 20.0000 mg | ORAL_TABLET | Freq: Every day | ORAL | 3 refills | Status: AC

## 2024-07-27 NOTE — Progress Notes (Signed)
 REFERRING PHYSICIAN:  Rollene Almarie Caldron*  PROVIDER:  LYNDA LEOS, MD  MRN: [id number] DOB: [birth year] DATE OF ENCOUNTER: 07/27/2024  Subjective   Chief Complaint: New Consultation     History of Present Illness: Teresa Monroe is a 68 y.o. female who is seen today as an office consultation at the request of Dr. Rollene for evaluation of New Consultation .   History of Present Illness Teresa Monroe is a 68 year old female who presents with abdominal pain.  She experiences pain under her ribs after eating certain foods, lasting approximately twelve hours without relief. This pain is a new development. An ultrasound revealed multiple gallstones, with the largest measuring 2.3 centimeters. Tomato-based foods, red meat, greasy, and spicy foods trigger her pain. She manages her diet by consuming chicken, mashed potatoes, rice, spinach, and salads. She is currently taking bupropion  and has been prescribed pravastatin  for cholesterol, which she has not yet started. She has no history of abdominal surgeries, heart attacks, strokes, or the use of blood thinners.     Review of Systems: A complete review of systems was obtained from the patient.  I have reviewed this information and discussed as appropriate with the patient.  See HPI as well for other ROS.  Review of Systems  Constitutional:  Negative for fever.  HENT:  Negative for congestion.   Eyes:  Negative for blurred vision.  Respiratory:  Negative for cough, shortness of breath and wheezing.   Cardiovascular:  Negative for chest pain and palpitations.  Gastrointestinal:  Positive for abdominal pain, nausea and vomiting. Negative for heartburn.  Genitourinary:  Negative for dysuria.  Musculoskeletal:  Negative for myalgias.  Skin:  Negative for rash.  Neurological:  Negative for dizziness and headaches.  Psychiatric/Behavioral:  Negative for depression and suicidal ideas.   All other systems reviewed and are  negative.     Medical History: History reviewed. No pertinent past medical history.  There is no problem list on file for this patient.   Past Surgical History:  Procedure Laterality Date  . CESAREAN SECTION    . foot surgery       No Known Allergies  Current Outpatient Medications on File Prior to Visit  Medication Sig Dispense Refill  . buPROPion  (WELLBUTRIN  XL) 150 MG XL tablet Take 150 mg by mouth once daily    . cyanocobalamin  (VITAMIN B12) 1,000 mcg/mL injection INJECT 1 ML (1,000 MCG TOTAL) INTO THE MUSCLE EVERY 30 (THIRTY) DAYS.    SABRA loratadine (CLARITIN) 10 mg capsule Take by mouth at bedtime    . pravastatin  (PRAVACHOL ) 20 MG tablet Take 20 mg by mouth once daily    . tetrahydrozoline-zinc (VISINE-AC) 0.05-0.25 % ophthalmic solution Apply 2 drops to eye     No current facility-administered medications on file prior to visit.    Family History  Problem Relation Age of Onset  . Colon cancer Brother      Social History   Tobacco Use  Smoking Status Former  . Types: Cigarettes  . Start date: 2020  Smokeless Tobacco Never     Social History   Socioeconomic History  . Marital status: Divorced  Tobacco Use  . Smoking status: Former    Types: Cigarettes    Start date: 2020  . Smokeless tobacco: Never  Substance and Sexual Activity  . Alcohol use: Yes    Alcohol/week: 1.0 - 2.0 standard drink of alcohol    Types: 1 - 2 Standard drinks or equivalent per week  .  Drug use: Never   Social Drivers of Corporate investment banker Strain: Low Risk  (07/02/2024)   Received from [facility]   Overall Financial Resource Strain (CARDIA)   . How hard is it for you to pay for the very basics like food, housing, medical care, and heating?: Not very hard  Food Insecurity: No Food Insecurity (07/02/2024)   Received from [facility]   Hunger Vital Sign   . Within the past 12 months, you worried that your food would run out before you got the money to buy more.: Never  true   . Within the past 12 months, the food you bought just didn't last and you didn't have money to get more.: Never true  Transportation Needs: No Transportation Needs (07/02/2024)   Received from [facility] - Transportation   . In the past 12 months, has lack of transportation kept you from medical appointments or from getting medications?: No   . In the past 12 months, has lack of transportation kept you from meetings, work, or from getting things needed for daily living?: No  Physical Activity: Insufficiently Active (07/02/2024)   Received from [facility]   Exercise Vital Sign   . On average, how many days per week do you engage in moderate to strenuous exercise (like a brisk walk)?: 1 day   . On average, how many minutes do you engage in exercise at this level?: 20 min  Stress: No Stress Concern Present (07/02/2024)   Received from [facility] - Occupational Stress Questionnaire   . Do you feel stress - tense, restless, nervous, or anxious, or unable to sleep at night because your mind is troubled all the time - these days?: Only a little  Social Connections: Socially Isolated (07/02/2024)   Received from [facility]   Social Connection and Isolation Panel   . In a typical week, how many times do you talk on the phone with family, friends, or neighbors?: Twice a week   . How often do you get together with friends or relatives?: Three times a week   . How often do you attend church or religious services?: Patient declined   . Do you belong to any clubs or organizations such as church groups, unions, fraternal or athletic groups, or school groups?: No   . Are you married, widowed, divorced, separated, never married, or living with a partner?: Divorced  Housing Stability: Unknown (07/27/2024)   Housing Stability Vital Sign   . Homeless in the Last Year: No    Objective:    Vitals:   07/27/24 1042 07/27/24 1043  BP: 122/80    Pulse: 96   Temp: 36.8 C (98.2 F)   SpO2: 98%   Weight: 56.4 kg (124 lb 6.4 oz)   Height: 170.2 cm (5' 7)   PainSc:  0-No pain    Body mass index is 19.48 kg/m.  Physical Exam  PE:  Constitutional: No acute distress, conversant, appears states age. Eyes: Anicteric sclerae, moist conjunctiva, no lid lag Lungs: Clear to auscultation bilaterally, normal respiratory effort CV: regular rate and rhythm, no murmurs, no peripheral edema, pedal pulses 2+ GI: Soft, no masses or hepatosplenomegaly, non-tender to palpation Skin: No rashes, palpation reveals normal turgor Psychiatric: appropriate judgment and insight, oriented to person, place, and time  Assessment and Plan:  Diagnoses and all orders for this visit:  Symptomatic cholelithiasis    Toccara Alford is a 68  y.o. female    We will proceed to the OR for a lap cholecystectomy. All risks and benefits were discussed with the patient to generally include: infection, bleeding, possible need for post op ERCP, damage to the bile ducts, and bile leak. Alternatives were offered and described.  All questions were answered and the patient voiced understanding of the procedure and wishes to proceed at this point with a laparoscopic cholecystectomy      No follow-ups on file.  Lynda Leos, MD, [facility], [place] [facility]

## 2024-09-03 ENCOUNTER — Other Ambulatory Visit: Payer: Self-pay

## 2024-09-03 ENCOUNTER — Inpatient Hospital Stay: Admit: 2024-09-03 | Admitting: General Surgery

## 2024-09-03 ENCOUNTER — Inpatient Hospital Stay (HOSPITAL_BASED_OUTPATIENT_CLINIC_OR_DEPARTMENT_OTHER)
Admission: EM | Admit: 2024-09-03 | Discharge: 2024-09-06 | DRG: 355 | Disposition: A | Discharge status: Home / Self Care | Attending: General Surgery | Admitting: General Surgery

## 2024-09-03 ENCOUNTER — Emergency Department (HOSPITAL_BASED_OUTPATIENT_CLINIC_OR_DEPARTMENT_OTHER)

## 2024-09-03 ENCOUNTER — Emergency Department (HOSPITAL_COMMUNITY): Admitting: Anesthesiology

## 2024-09-03 ENCOUNTER — Encounter (HOSPITAL_COMMUNITY)
Admission: EM | Disposition: A | Discharge status: Home / Self Care | Payer: Self-pay | Source: Home / Self Care | Attending: General Surgery

## 2024-09-03 ENCOUNTER — Encounter (HOSPITAL_BASED_OUTPATIENT_CLINIC_OR_DEPARTMENT_OTHER): Payer: Self-pay | Admitting: Emergency Medicine

## 2024-09-03 DIAGNOSIS — Z8249 Family history of ischemic heart disease and other diseases of the circulatory system: Secondary | ICD-10-CM

## 2024-09-03 DIAGNOSIS — K56609 Unspecified intestinal obstruction, unspecified as to partial versus complete obstruction: Secondary | ICD-10-CM | POA: Diagnosis present

## 2024-09-03 DIAGNOSIS — Z801 Family history of malignant neoplasm of trachea, bronchus and lung: Secondary | ICD-10-CM

## 2024-09-03 DIAGNOSIS — K219 Gastro-esophageal reflux disease without esophagitis: Secondary | ICD-10-CM | POA: Diagnosis present

## 2024-09-03 DIAGNOSIS — Z9049 Acquired absence of other specified parts of digestive tract: Secondary | ICD-10-CM | POA: Diagnosis not present

## 2024-09-03 DIAGNOSIS — Z87891 Personal history of nicotine dependence: Secondary | ICD-10-CM

## 2024-09-03 DIAGNOSIS — Z800 Family history of malignant neoplasm of digestive organs: Secondary | ICD-10-CM

## 2024-09-03 DIAGNOSIS — K432 Incisional hernia without obstruction or gangrene: Secondary | ICD-10-CM | POA: Diagnosis present

## 2024-09-03 DIAGNOSIS — E785 Hyperlipidemia, unspecified: Secondary | ICD-10-CM | POA: Diagnosis present

## 2024-09-03 DIAGNOSIS — Z885 Allergy status to narcotic agent status: Secondary | ICD-10-CM

## 2024-09-03 DIAGNOSIS — F32A Depression, unspecified: Secondary | ICD-10-CM | POA: Diagnosis present

## 2024-09-03 DIAGNOSIS — Z743 Need for continuous supervision: Secondary | ICD-10-CM | POA: Diagnosis not present

## 2024-09-03 DIAGNOSIS — K430 Incisional hernia with obstruction, without gangrene: Secondary | ICD-10-CM | POA: Diagnosis present

## 2024-09-03 DIAGNOSIS — Z79899 Other long term (current) drug therapy: Secondary | ICD-10-CM | POA: Diagnosis not present

## 2024-09-03 DIAGNOSIS — E860 Dehydration: Secondary | ICD-10-CM | POA: Diagnosis present

## 2024-09-03 DIAGNOSIS — R1084 Generalized abdominal pain: Secondary | ICD-10-CM | POA: Diagnosis not present

## 2024-09-03 HISTORY — PX: INCISIONAL HERNIA REPAIR: SHX193

## 2024-09-03 LAB — URINALYSIS, ROUTINE W REFLEX MICROSCOPIC
Bacteria, UA: NONE SEEN
Bilirubin Urine: NEGATIVE
Glucose, UA: NEGATIVE mg/dL
Ketones, ur: >80 mg/dL — AB
Leukocytes,Ua: NEGATIVE
Nitrite: NEGATIVE
Protein, ur: 30 mg/dL — AB
Specific Gravity, Urine: >1.046 — ABNORMAL HIGH (ref 1.005–1.030)
pH: 6.5 (ref 5.0–8.0)

## 2024-09-03 LAB — CBC
HCT: 44.2 % (ref 36.0–46.0)
Hemoglobin: 15.8 g/dL — ABNORMAL HIGH (ref 12.0–15.0)
MCH: 35 pg — ABNORMAL HIGH (ref 26.0–34.0)
MCHC: 35.7 g/dL (ref 30.0–36.0)
MCV: 97.8 fL (ref 80.0–100.0)
Platelets: 373 K/uL (ref 150–400)
RBC: 4.52 MIL/uL (ref 3.87–5.11)
RDW: 11 % — ABNORMAL LOW (ref 11.5–15.5)
WBC: 9.4 K/uL (ref 4.0–10.5)
nRBC: 0 % (ref 0.0–0.2)

## 2024-09-03 LAB — COMPREHENSIVE METABOLIC PANEL WITH GFR
ALT: 13 U/L (ref 0–44)
AST: 19 U/L (ref 15–41)
Albumin: 4.5 g/dL (ref 3.5–5.0)
Alkaline Phosphatase: 67 U/L (ref 38–126)
Anion gap: 19 — ABNORMAL HIGH (ref 5–15)
BUN: 16 mg/dL (ref 8–23)
CO2: 24 mmol/L (ref 22–32)
Calcium: 10 mg/dL (ref 8.9–10.3)
Chloride: 92 mmol/L — ABNORMAL LOW (ref 98–111)
Creatinine, Ser: 0.67 mg/dL (ref 0.44–1.00)
GFR, Estimated: >60 mL/min (ref >60)
Glucose, Bld: 95 mg/dL (ref 70–99)
Potassium: 3.9 mmol/L (ref 3.5–5.1)
Sodium: 135 mmol/L (ref 135–145)
Total Bilirubin: 1.6 mg/dL — ABNORMAL HIGH (ref 0.0–1.2)
Total Protein: 7.7 g/dL (ref 6.5–8.1)

## 2024-09-03 LAB — I-STAT VENOUS BLOOD GAS, ED
Acid-Base Excess: 4 mmol/L — ABNORMAL HIGH (ref 0.0–2.0)
Bicarbonate: 27.9 mmol/L (ref 20.0–28.0)
Calcium, Ion: 1.17 mmol/L (ref 1.15–1.40)
HCT: 41 % (ref 36.0–46.0)
Hemoglobin: 13.9 g/dL (ref 12.0–15.0)
O2 Saturation: 51 %
Patient temperature: 98
Potassium: 3.9 mmol/L (ref 3.5–5.1)
Sodium: 133 mmol/L — ABNORMAL LOW (ref 135–145)
TCO2: 29 mmol/L (ref 22–32)
pCO2, Ven: 39.6 mmHg — ABNORMAL LOW (ref 44–60)
pH, Ven: 7.454 — ABNORMAL HIGH (ref 7.25–7.43)
pO2, Ven: 25 mmHg — CL (ref 32–45)

## 2024-09-03 LAB — LACTIC ACID, PLASMA: Lactic Acid, Venous: 1.2 mmol/L (ref 0.5–1.9)

## 2024-09-03 LAB — LIPASE, BLOOD: Lipase: 27 U/L (ref 11–51)

## 2024-09-03 SURGERY — REPAIR, HERNIA, INCISIONAL
Anesthesia: General

## 2024-09-03 MED ORDER — ONDANSETRON HCL 4 MG/2ML IJ SOLN
4.0000 mg | Freq: Once | INTRAMUSCULAR | Status: AC
  Administered 2024-09-03: 4 mg via INTRAVENOUS
  Filled 2024-09-03: qty 2

## 2024-09-03 MED ORDER — LACTATED RINGERS IV SOLN: INTRAVENOUS | Status: DC | PRN

## 2024-09-03 MED ORDER — LIDOCAINE HCL 2 % IJ SOLN
INTRAMUSCULAR | Status: AC
  Filled 2024-09-03: qty 20

## 2024-09-03 MED ORDER — LIDOCAINE 2% (20 MG/ML) 5 ML SYRINGE
INTRAMUSCULAR | Status: DC | PRN
  Administered 2024-09-03: 60 mg via INTRAVENOUS

## 2024-09-03 MED ORDER — ONDANSETRON HCL 4 MG/2ML IJ SOLN
INTRAMUSCULAR | Status: AC
  Filled 2024-09-03: qty 2

## 2024-09-03 MED ORDER — FENTANYL CITRATE (PF) 50 MCG/ML IJ SOSY
50.0000 ug | PREFILLED_SYRINGE | Freq: Once | INTRAMUSCULAR | Status: AC
  Administered 2024-09-03: 50 ug via INTRAVENOUS
  Filled 2024-09-03 (×2): qty 1

## 2024-09-03 MED ORDER — SUCCINYLCHOLINE CHLORIDE 200 MG/10ML IV SOSY
PREFILLED_SYRINGE | INTRAVENOUS | Status: AC
  Filled 2024-09-03: qty 10

## 2024-09-03 MED ORDER — SUGAMMADEX SODIUM 200 MG/2ML IV SOLN
INTRAVENOUS | Status: DC | PRN
  Administered 2024-09-03: 200 mg via INTRAVENOUS

## 2024-09-03 MED ORDER — DEXAMETHASONE SOD PHOSPHATE PF 10 MG/ML IJ SOLN
INTRAMUSCULAR | Status: DC | PRN
  Administered 2024-09-03: 10 mg via INTRAVENOUS

## 2024-09-03 MED ORDER — LIDOCAINE HCL (PF) 2% IJ FOR NEBU
2.5000 mL | Freq: Once | RESPIRATORY_TRACT | Status: DC
  Filled 2024-09-03: qty 5

## 2024-09-03 MED ORDER — LACTATED RINGERS IV BOLUS
1000.0000 mL | Freq: Once | INTRAVENOUS | Status: AC
  Administered 2024-09-03: 1000 mL via INTRAVENOUS

## 2024-09-03 MED ORDER — DEXTROSE-SODIUM CHLORIDE 5-0.45 % IV SOLN: INTRAVENOUS | Status: DC

## 2024-09-03 MED ORDER — LIDOCAINE 2% (20 MG/ML) 5 ML SYRINGE
INTRAMUSCULAR | Status: AC
  Filled 2024-09-03: qty 5

## 2024-09-03 MED ORDER — SUCCINYLCHOLINE CHLORIDE 200 MG/10ML IV SOSY
PREFILLED_SYRINGE | INTRAVENOUS | Status: DC | PRN
  Administered 2024-09-03: 100 mg via INTRAVENOUS

## 2024-09-03 MED ORDER — LIDOCAINE-EPINEPHRINE (PF) 2 %-1:200000 IJ SOLN
INTRAMUSCULAR | Status: AC
  Filled 2024-09-03: qty 20

## 2024-09-03 MED ORDER — BUPIVACAINE-EPINEPHRINE 0.25% -1:200000 IJ SOLN
INTRAMUSCULAR | Status: DC | PRN
  Administered 2024-09-03: 30 mL

## 2024-09-03 MED ORDER — ROCURONIUM BROMIDE 10 MG/ML (PF) SYRINGE
PREFILLED_SYRINGE | INTRAVENOUS | Status: AC
  Filled 2024-09-03: qty 10

## 2024-09-03 MED ORDER — CEFAZOLIN SODIUM-DEXTROSE 2-4 GM/100ML-% IV SOLN
2.0000 g | INTRAVENOUS | Status: AC
  Administered 2024-09-03: 2 g via INTRAVENOUS

## 2024-09-03 MED ORDER — ROCURONIUM BROMIDE 10 MG/ML (PF) SYRINGE
PREFILLED_SYRINGE | INTRAVENOUS | Status: DC | PRN
  Administered 2024-09-03: 60 mg via INTRAVENOUS

## 2024-09-03 MED ORDER — PHENYLEPHRINE 80 MCG/ML (10ML) SYRINGE FOR IV PUSH (FOR BLOOD PRESSURE SUPPORT)
PREFILLED_SYRINGE | INTRAVENOUS | Status: DC | PRN
  Administered 2024-09-03: 240 ug via INTRAVENOUS

## 2024-09-03 MED ORDER — FENTANYL CITRATE (PF) 250 MCG/5ML IJ SOLN
INTRAMUSCULAR | Status: DC | PRN
  Administered 2024-09-03: 100 ug via INTRAVENOUS
  Administered 2024-09-03 – 2024-09-04 (×2): 50 ug via INTRAVENOUS

## 2024-09-03 MED ORDER — IOHEXOL 300 MG/ML  SOLN
100.0000 mL | Freq: Once | INTRAMUSCULAR | Status: AC | PRN
  Administered 2024-09-03: 100 mL via INTRAVENOUS

## 2024-09-03 MED ORDER — EPHEDRINE SULFATE-NACL 50-0.9 MG/10ML-% IV SOSY
PREFILLED_SYRINGE | INTRAVENOUS | Status: DC | PRN
  Administered 2024-09-03: 5 mg via INTRAVENOUS

## 2024-09-03 MED ORDER — BUPIVACAINE-EPINEPHRINE (PF) 0.25% -1:200000 IJ SOLN
INTRAMUSCULAR | Status: AC
  Filled 2024-09-03: qty 30

## 2024-09-03 MED ORDER — BENZOCAINE 20 % MT AERO
INHALATION_SPRAY | Freq: Once | OROMUCOSAL | Status: AC
  Filled 2024-09-03: qty 57

## 2024-09-03 MED ORDER — PROPOFOL 10 MG/ML IV BOLUS
INTRAVENOUS | Status: AC
Start: 2024-09-03 — End: 2024-09-03
  Filled 2024-09-03: qty 20

## 2024-09-03 MED ORDER — PHENYLEPHRINE HCL-NACL 20-0.9 MG/250ML-% IV SOLN: INTRAVENOUS | Status: DC | PRN

## 2024-09-03 MED ORDER — ONDANSETRON HCL 4 MG/2ML IJ SOLN
INTRAMUSCULAR | Status: DC | PRN
  Administered 2024-09-03: 4 mg via INTRAVENOUS

## 2024-09-03 MED ORDER — PROPOFOL 10 MG/ML IV BOLUS
INTRAVENOUS | Status: DC | PRN
  Administered 2024-09-03: 160 mg via INTRAVENOUS

## 2024-09-03 MED ORDER — FENTANYL CITRATE (PF) 250 MCG/5ML IJ SOLN
INTRAMUSCULAR | Status: AC
  Filled 2024-09-03: qty 5

## 2024-09-03 MED ORDER — 0.9 % SODIUM CHLORIDE (POUR BTL) OPTIME
TOPICAL | Status: DC | PRN
  Administered 2024-09-03: 1000 mL

## 2024-09-03 SURGICAL SUPPLY — 36 items
BAG COUNTER SPONGE SURGICOUNT (BAG) ×1 IMPLANT
BLADE CLIPPER SURG (BLADE) IMPLANT
CANISTER SUCTION 3000ML PPV (SUCTIONS) IMPLANT
CHLORAPREP W/TINT 26 (MISCELLANEOUS) ×1 IMPLANT
COTTONBALL LRG STERILE PKG (GAUZE/BANDAGES/DRESSINGS) ×1 IMPLANT
COVER SURGICAL LIGHT HANDLE (MISCELLANEOUS) ×1 IMPLANT
DERMABOND ADVANCED .7 DNX12 (GAUZE/BANDAGES/DRESSINGS) ×1 IMPLANT
DRAPE LAPAROTOMY 100X72 PEDS (DRAPES) ×1 IMPLANT
DRSG OPSITE POSTOP 4X8 (GAUZE/BANDAGES/DRESSINGS) IMPLANT
ELECTRODE REM PT RTRN 9FT ADLT (ELECTROSURGICAL) ×1 IMPLANT
GAUZE 4X4 16PLY ~~LOC~~+RFID DBL (SPONGE) IMPLANT
GLOVE BIOGEL PI MICRO STRL 6 (GLOVE) ×1 IMPLANT
GLOVE INDICATOR 6.5 STRL GRN (GLOVE) ×1 IMPLANT
GOWN STRL REUS W/ TWL LRG LVL3 (GOWN DISPOSABLE) ×2 IMPLANT
KIT BASIN OR (CUSTOM PROCEDURE TRAY) ×1 IMPLANT
KIT TURNOVER KIT B (KITS) ×1 IMPLANT
MESH VICRYL KNITTED 12X12 (Mesh General) IMPLANT
NDL HYPO 25GX1X1/2 BEV (NEEDLE) ×1 IMPLANT
NEEDLE HYPO 25GX1X1/2 BEV (NEEDLE) ×1 IMPLANT
PACK GENERAL/GYN (CUSTOM PROCEDURE TRAY) ×1 IMPLANT
PAD ARMBOARD POSITIONER FOAM (MISCELLANEOUS) ×1 IMPLANT
SOLN 0.9% NACL 1000 ML (IV SOLUTION) ×1 IMPLANT
SOLN 0.9% NACL POUR BTL 1000ML (IV SOLUTION) ×1 IMPLANT
STAPLER SKIN PROX 35W (STAPLE) IMPLANT
SUT MNCRL AB 4-0 PS2 18 (SUTURE) ×1 IMPLANT
SUT PDS AB 1 CT1 36 (SUTURE) IMPLANT
SUT PDS AB 2-0 CT2 27 (SUTURE) IMPLANT
SUT PDS PLUS AB 0 CT-2 (SUTURE) ×1 IMPLANT
SUT VIC AB 0 CT1 27XBRD ANBCTR (SUTURE) IMPLANT
SUT VIC AB 0 CT2 27 (SUTURE) IMPLANT
SUT VIC AB 2-0 CT2 27 (SUTURE) IMPLANT
SUT VIC AB 2-0 SH 18 (SUTURE) IMPLANT
SUT VIC AB 3-0 SH 27X BRD (SUTURE) ×1 IMPLANT
SYR CONTROL 10ML LL (SYRINGE) ×1 IMPLANT
TOWEL GREEN STERILE (TOWEL DISPOSABLE) ×1 IMPLANT
TOWEL GREEN STERILE FF (TOWEL DISPOSABLE) ×1 IMPLANT

## 2024-09-03 NOTE — ED Triage Notes (Signed)
 Had gallbladder removed on 10/6. Reports n/v since. Lower abd cramping.

## 2024-09-03 NOTE — ED Notes (Signed)
 Report given to the Floor RN.

## 2024-09-03 NOTE — H&P (Signed)
 HPI  Teresa Monroe is an 68 y.o. female who is recently s/p laparoscopic cholecystectomy with Dr. Rubin who presents with umbilical incisional hernia causing SBO.  Patient has had nausea, emesis, and lower abdominal pain since Thursday. She has had decreased PO intake because of this. Was prescribed zofran  which initially helped. She woke up around 0200 today with several episodes of nausea and emesis and without much relief from zofran . Still passing gas. Had a normal bowel movement this AM.  CT scan shows loop of small bowel within umbilical incisional hernia causing obstruction with dilated proximal loops of bowel and decompressed distal bowel.  No leukocytosis.  10 point review of systems is negative except as listed above in HPI.  Objective  Past Medical History: Past Medical History:  Diagnosis Date   ALLERGIC RHINITIS    DEPRESSION    controlled with meds   Dyslipidemia    Dx CPX 09/2013 - lifestyle changes recommended   GERD    IBS (irritable bowel syndrome)    VITAMIN B12 DEFICIENCY dx 08/2009   previously self administering monthly IM replacement    Past Surgical History: Past Surgical History:  Procedure Laterality Date   CESAREAN SECTION  1989, 1992   x's 2   METATARSAL OSTEOTOMY Left 2007   5th MT with rod repair    Family History:  Family History  Problem Relation Age of Onset   Hypertension Mother    Lung disease Mother    Lung cancer Father 31   Cancer Father    Colon cancer Brother 72       stage 4, cured with resection, XRT   Breast cancer Neg Hx     Social History:  reports that she has quit smoking. Her smoking use included cigarettes. She has a 10 pack-year smoking history. She has never used smokeless tobacco. She reports current alcohol use of about 6.0 standard drinks of alcohol per week. She reports that she does not use drugs.  Allergies: No Known Allergies  Medications: I have reviewed the patient's current  medications.  Labs: I have personally reviewed all labs for the past 24h  Imaging: I have personally reviewed and interpreted all imaging for the past 24h and agree with the radiologist's impression.  CT ABDOMEN PELVIS W CONTRAST Result Date: 09/03/2024 CLINICAL DATA:  Abdominal pain, post-op concern for incisional hernia. Recent cholecystectomy 08/27/2024 EXAM: CT ABDOMEN AND PELVIS WITH CONTRAST TECHNIQUE: Multidetector CT imaging of the abdomen and pelvis was performed using the standard protocol following bolus administration of intravenous contrast. RADIATION DOSE REDUCTION: This exam was performed according to the departmental dose-optimization program which includes automated exposure control, adjustment of the mA and/or kV according to patient size and/or use of iterative reconstruction technique. CONTRAST:  OMNIPAQUE IOHEXOL 300 MG/ML  SOLN COMPARISON:  Ultrasound 07/11/2024 FINDINGS: Lower chest: Lung bases clear. No effusions. There is air noted in the anterior lower chest and into the anterior upper abdomen anterior to the left heart border. This appears to be extraperitoneal and is likely related to recent surgery. Hepatobiliary: Changes of cholecystectomy. Small fluid collection in the gallbladder fossa compatible with recent surgery. No focal hepatic abnormality or biliary ductal dilatation. Pancreas: No focal abnormality or ductal dilatation. Spleen: No focal abnormality.  Normal size. Adrenals/Urinary Tract: No adrenal abnormality. No focal renal abnormality. No stones or hydronephrosis. Urinary bladder is unremarkable. Stomach/Bowel: Stomach is unremarkable. There are dilated small bowel loops with air-fluid levels extending into the pelvis. There is an  umbilical hernia containing a small bowel loop. Distal small bowel exiting the hernia is decompressed. Findings compatible with associated small bowel obstruction. Normal appendix. Vascular/Lymphatic: No evidence of aneurysm or  adenopathy. Reproductive: Calcified fibroid in the uterus.  No adnexal mass. Other: Small amount of free fluid in the pelvis and adjacent to the liver. Musculoskeletal: No acute bony abnormality. Air throughout the left anterior and lateral abdominal wall, likely from recent surgery. IMPRESSION: Changes of recent cholecystectomy. Small fluid collection in the gallbladder fossa with small amount of perihepatic and pelvic free fluid. There is also extraperitoneal gas anterior to the liver and heart in the anterior chest/abdominal wall. These findings are presumably related to recent surgery. Umbilical hernia containing a loop of small bowel. This causes small bowel obstruction. Electronically Signed   By: Franky Crease M.D.   On: 09/03/2024 18:17     Physical Exam Blood pressure 134/77, pulse 75, temperature 98.1 F (36.7 C), temperature source Oral, resp. rate 15, SpO2 100%. General: no acute distress HEENT: normocephalic, atraumatic Oropharynx: mucous membranes moist CV: Regular rate and rhythm, normotensive Chest: equal chest rise bilaterally normal respiratory effort on room air Abdomen: soft, tender to palpation at umbilical incision, bowel was able to be reduced but immediately recurred Extremities: moves all extremities Skin: warm, dry, no rashes Psych: normal memory, normal mood/affect  Neuro: No focal neurologic deficits, A&Ox3    Assessment   Teresa Monroe is an 68 y.o. female s/p lap chole on 10/6 with Dr. Rubin presenting with SBO with partially incarcerated bowel at umbilical incision site.  Plan  - Proceed to OR for repair of incisional hernia - We discussed the risks of operation including but not limited to: bleeding, deep and superficial surgical site infections including abscess, injury to surrounding structures with need for repair, reoperation, hernia, possible need for larger laparotomy and bowel resection. All questions were answered. Patient voiced understanding and  agreed to proceed with surgery.  - NPO, IVF - Difficulty with NGT placement at [facility], will order lidocaine neb and bedside RN to reattempt - DVT - SCDs, LMWH - Will plan for admission to med-surg post-op  I reviewed ED provider notes, last 24 h vitals and pain scores, last 48 h intake and output, last 24 h labs and trends, and last 24 h imaging results.  This care required moderate level of medical decision making.      Orie Silversmith, MD [facility]

## 2024-09-03 NOTE — ED Notes (Signed)
 Istat performed by RRT

## 2024-09-03 NOTE — ED Notes (Signed)
 Patient unable to provide a urine sample at this time.

## 2024-09-03 NOTE — Anesthesia Procedure Notes (Signed)
 Procedure Name: Intubation Date/Time: 09/03/2024 10:19 PM  Performed by: Jaylina Ramdass T, CRNAPre-anesthesia Checklist: Patient identified, Emergency Drugs available, Suction available and Patient being monitored Patient Re-evaluated:Patient Re-evaluated prior to induction Oxygen  Delivery Method: Circle system utilized Preoxygenation: Pre-oxygenation with 100% oxygen  Induction Type: IV induction and Rapid sequence Ventilation: Mask ventilation without difficulty Laryngoscope Size: Miller and 2 Grade View: Grade II Tube type: Oral Tube size: 7.0 mm Number of attempts: 1 Airway Equipment and Method: Stylet and Oral airway Placement Confirmation: ETT inserted through vocal cords under direct vision, positive ETCO2 and breath sounds checked- equal and bilateral Secured at: 22 cm Tube secured with: Tape Dental Injury: Teeth and Oropharynx as per pre-operative assessment

## 2024-09-03 NOTE — ED Provider Notes (Signed)
 [facility] AT [facility] Provider Note   CSN: [id number] Arrival date & time: 09/03/24  1334     Patient presents with: Post-op Problem   Teresa Monroe is a 68 y.o. female with h/o HLD, recent cholecystectomy on 08/27/24 presents to the ER today for evaluation of nausea, vomiting, and lower abdominal pain since Thursday. The patient reports that she has had decrease in PO intake because of her nausea. She called her surgeon who sent her in some Zofran  on Friday.  She reports that Saturday she felt okay.  Taking the Zofran .  She reports that she started well with some lower abdominal pain that was coming and going.  No exacerbating factors.  Reports additionally felt better to put ice on the area however last night this did not help her symptoms.  She reports that she woke up this morning around 0200 with a few episodes of nausea and vomiting.  She tried the Zofran  but without significant relief.  Does feel some nausea but has not vomited nor does she feels extreme nausea as she did before.  Denies any dysuria, hematuria, fever, or hematemesis.  Reports that she still passing gas.  Last bowel movement was this morning that was normal form and brown.  Reports that her last Tylenol  was on her several day on Tuesday.  She was still passing gas in between here however.  She called her surgeon and he reports that she wait a few days or come into the emerged ferment for either evaluations the patient returned here.  She does not have any allergies to any medications.  Denies any tobacco, EtOH illicit drug use.  HPI     Prior to Admission medications   Medication Sig Start Date End Date Taking? Authorizing Provider  buPROPion  (WELLBUTRIN  XL) 150 MG 24 hr tablet Take 1 tablet (150 mg total) by mouth daily. 12/27/23   Rollene Almarie LABOR, MD  ciclopirox  (PENLAC ) 8 % solution Apply topically at bedtime. Apply over nail and surrounding skin. Apply daily over previous coat.  After seven (7) days, may remove with alcohol and continue cycle. 12/27/23   Rollene Almarie LABOR, MD  cyanocobalamin  (VITAMIN B12) 1000 MCG/ML injection INJECT 1 ML (1,000 MCG TOTAL) INTO THE MUSCLE EVERY 30 (THIRTY) DAYS. 12/27/23   Rollene Almarie LABOR, MD  Loratadine 10 MG CAPS Take by mouth at bedtime.    [provider]  Multiple Vitamin (MULTIVITAMIN WITH MINERALS) TABS tablet Take 1 tablet by mouth daily.    [provider]  pravastatin  (PRAVACHOL ) 20 MG tablet Take 1 tablet (20 mg total) by mouth daily. 07/27/24   Rollene Almarie LABOR, MD  tetrahydrozoline-zinc (VISINE-AC) 0.05-0.25 % ophthalmic solution Place 2 drops into both eyes as needed (itching).    [provider]  TUBERCULIN SYR 1CC/25GX5/8 (B-D TB SYRINGE 1CC/25GX5/8) 25G X 5/8 1 ML MISC Use monthly 12/27/23   Rollene Almarie LABOR, MD    Allergies: Patient has no known allergies.    Review of Systems  Constitutional:  Negative for chills and fever.  Respiratory:  Negative for shortness of breath.   Cardiovascular:  Negative for chest pain.  Gastrointestinal:  Positive for abdominal pain, nausea and vomiting. Negative for constipation and diarrhea.  Genitourinary:  Negative for dysuria and hematuria.    Updated Vital Signs BP 129/78   Pulse 78   Temp 98.1 F (36.7 C) (Oral)   Resp 18   SpO2 98%   Physical Exam Vitals and nursing note reviewed.  Constitutional:      General: She is not in acute distress.    Appearance: She is not ill-appearing or toxic-appearing.  HENT:     Mouth/Throat:     Mouth: Mucous membranes are dry.  Eyes:     General: No scleral icterus. Cardiovascular:     Rate and Rhythm: Normal rate.  Pulmonary:     Effort: Pulmonary effort is normal. No respiratory distress.  Abdominal:     Palpations: Abdomen is soft.     Tenderness: There is abdominal tenderness. There is no guarding or rebound.     Hernia: A hernia is present.     Comments: Incisions intact and  well appearing. Patient does have a palpable hernia present at the lower midline incision. Was able to reduce. Soft otherwise. Some diffuse tenderness, but mainly to the lower incision.   Skin:    General: Skin is warm and dry.  Neurological:     Mental Status: She is alert.     (all labs ordered are listed, but only abnormal results are displayed) Labs Reviewed  COMPREHENSIVE METABOLIC PANEL WITH GFR - Abnormal; Notable for the following components:      Result Value   Chloride 92 (*)    Total Bilirubin 1.6 (*)    Anion gap 19 (*)    All other components within normal limits  CBC - Abnormal; Notable for the following components:   Hemoglobin 15.8 (*)    MCH 35.0 (*)    RDW 11.0 (*)    All other components within normal limits  URINALYSIS, ROUTINE W REFLEX MICROSCOPIC - Abnormal; Notable for the following components:   Specific Gravity, Urine >1.046 (*)    Hgb urine dipstick SMALL (*)    Ketones, ur >80 (*)    Protein, ur 30 (*)    All other components within normal limits  I-STAT VENOUS BLOOD GAS, ED - Abnormal; Notable for the following components:   pH, Ven 7.454 (*)    pCO2, Ven 39.6 (*)    pO2, Ven 25 (*)    Acid-Base Excess 4.0 (*)    Sodium 133 (*)    All other components within normal limits  LIPASE, BLOOD  LACTIC ACID, PLASMA    EKG: EKG Interpretation Date/Time:  Monday September 03 2024 16:54:00 EDT Ventricular Rate:  79 PR Interval:  162 QRS Duration:  85 QT Interval:  390 QTC Calculation: 448 R Axis:   64  Text Interpretation: Sinus rhythm No significant change since last tracing Confirmed by Doretha Folks ([id number]) on 09/03/2024 5:29:47 PM  Radiology: CT ABDOMEN PELVIS W CONTRAST Result Date: 09/03/2024 CLINICAL DATA:  Abdominal pain, post-op concern for incisional hernia. Recent cholecystectomy 08/27/2024 EXAM: CT ABDOMEN AND PELVIS WITH CONTRAST TECHNIQUE: Multidetector CT imaging of the abdomen and pelvis was performed using the standard protocol  following bolus administration of intravenous contrast. RADIATION DOSE REDUCTION: This exam was performed according to the departmental dose-optimization program which includes automated exposure control, adjustment of the mA and/or kV according to patient size and/or use of iterative reconstruction technique. CONTRAST:  OMNIPAQUE IOHEXOL 300 MG/ML  SOLN COMPARISON:  Ultrasound 07/11/2024 FINDINGS: Lower chest: Lung bases clear. No effusions. There is air noted in the anterior lower chest and into the anterior upper abdomen anterior to the left heart border. This appears to be extraperitoneal and is likely related to recent surgery. Hepatobiliary: Changes of cholecystectomy. Small fluid collection in the gallbladder fossa compatible with recent surgery. No focal hepatic abnormality or biliary  ductal dilatation. Pancreas: No focal abnormality or ductal dilatation. Spleen: No focal abnormality.  Normal size. Adrenals/Urinary Tract: No adrenal abnormality. No focal renal abnormality. No stones or hydronephrosis. Urinary bladder is unremarkable. Stomach/Bowel: Stomach is unremarkable. There are dilated small bowel loops with air-fluid levels extending into the pelvis. There is an umbilical hernia containing a small bowel loop. Distal small bowel exiting the hernia is decompressed. Findings compatible with associated small bowel obstruction. Normal appendix. Vascular/Lymphatic: No evidence of aneurysm or adenopathy. Reproductive: Calcified fibroid in the uterus.  No adnexal mass. Other: Small amount of free fluid in the pelvis and adjacent to the liver. Musculoskeletal: No acute bony abnormality. Air throughout the left anterior and lateral abdominal wall, likely from recent surgery. IMPRESSION: Changes of recent cholecystectomy. Small fluid collection in the gallbladder fossa with small amount of perihepatic and pelvic free fluid. There is also extraperitoneal gas anterior to the liver and heart in the anterior  chest/abdominal wall. These findings are presumably related to recent surgery. Umbilical hernia containing a loop of small bowel. This causes small bowel obstruction. Electronically Signed   By: Franky Crease M.D.   On: 09/03/2024 18:17    Hernia reduction  Date/Time: 09/03/2024 7:29 PM  Performed by: Bernis Ernst, PA-C Authorized by: Bernis Ernst, PA-C  Consent: Verbal consent obtained Risks and benefits: risks, benefits and alternatives were discussed Consent given by: patient Imaging studies: imaging studies available Patient identity confirmed: verbally with patient Patient tolerance: patient tolerated the procedure well with no immediate complications Comments: Hernia was able to be reduced with gentle, steady pressure. However it was a recurrent protrusion that was soft and easily reducible. No overlying erythema or warmth.       Medications Ordered in the ED  lactated ringers bolus 1,000 mL (1,000 mLs Intravenous New Bag/Given 09/03/24 1730)  ondansetron  (ZOFRAN ) injection 4 mg (4 mg Intravenous Given 09/03/24 1752)  iohexol (OMNIPAQUE) 300 MG/ML solution 100 mL (100 mLs Intravenous Contrast Given 09/03/24 1756)  lactated ringers bolus 1,000 mL (1,000 mLs Intravenous New Bag/Given 09/03/24 1822)   Medical Decision Making Amount and/or Complexity of Data Reviewed Labs: ordered. Radiology: ordered.  Risk Prescription drug management. Decision regarding hospitalization.   68 y.o. female presents to the ER for evaluation of lower abdominal pain with nausea and vomiting. Differential diagnosis includes but is not limited to AAA, mesenteric ischemia, appendicitis, diverticulitis, DKA, gastroenteritis, nephrolithiasis, pancreatitis, constipation, UTI, bowel obstruction, biliary disease, IBD, PUD, hepatitis, post op complication. Vital signs unremarkable. Physical exam as noted above.   On previous chart evaluation, patient underwent laparoscopic cholecystectomy with Dr. Rubin  on 10 - 6 - 25.  This was a scheduled outpatient procedure.  I do appreciate a hernia to the inferior midline incision.  Likely was causing patient's symptoms.  Was able to reduce.  Will order CT abdomen given her recent surgery.  Have ordered her labs and fluid.  Patient appears dehydrated on examination.  She does have a history of prolonged QT which I have reordered an EKG.  QTc at 448.  Fluids and Zofran  was ordered.  I independently reviewed and interpreted the patient's labs.  VBG shows pH 7.45.  CMP shows chloride 92, total bili 1.6 with an anion gap of 19.  Otherwise no electrolyte or LFT abnormality.  Lipase within normal limits.  CBC does show hemoconcentration with hemoglobin of 15.8 and appears around 14.  No [place] ptosis.  Lactic acid within normal limits.  Urinalysis is concentrated urine with small amount hemoglobin present.  Greater than 80 ketones and 30 protein.  Otherwise unremarkable.  On reevaluation, patient reports her nausea has resolved with the medications.  Will still continue to give IV fluids.  CT abdomen pelvis shows changes of recent cholecystectomy. Small fluid collection in the gallbladder fossa with small amount of perihepatic and pelvic free fluid. There is also extraperitoneal gas anterior to the liver and heart in the anterior chest/abdominal wall. These findings are presumably related to recent surgery. Umbilical hernia containing a loop of small bowel. This causes small bowel obstruction. Per radiologist's interpretation.    Consult to general surgery placed.  Hernia was able to be reduced with gentle even pressure.  My attending assessed the patient at bedside as well was able to easily reduce the hernia however she deeply exhales and her hernia slid out again.  Was able to be reduced however given its ease with its movement, question of this this was causing her small bowel obstruction symptoms.  Spoke with Dr. Ann with general surgery.  Plan for NG tube  insertion.  She will contact the OR for potential surgery tonight to see if she can fix that hernia, or if not it will be tomorrow morning.  Will handoff to oncoming shift to follow-up with Dr. Ann recommendations, admission, potential transfer to the OR.  7:28 PM Care of Teresa Monroe transferred to PA Lavanda Lesches at the end of my shift as the patient will require reassessment once labs/imaging have resulted. Patient presentation, ED course, and plan of care discussed with review of all pertinent labs and imaging. Please see his/her note for further details regarding further ED course and disposition. Plan at time of handoff is follow up with general surgery recommendations, admit. This may be altered or completely changed at the discretion of the oncoming team pending results of further workup.   Portions of this report may have been transcribed using voice recognition software. Every effort was made to ensure accuracy; however, inadvertent computerized transcription errors may be present.    Final diagnoses:  None    ED Discharge Orders     None          Bernis Ernst, [place] 09/03/24 1931    Doretha Folks, MD 09/03/24 2332

## 2024-09-03 NOTE — Anesthesia Preprocedure Evaluation (Signed)
 Anesthesia Evaluation  Patient identified by MRN, date of birth, ID band Patient awake    Reviewed: Allergy & Precautions, NPO status , Patient's Chart, lab work & pertinent test results  Airway Mallampati: II  TM Distance: >3 FB Neck ROM: Full    Dental  (+) Dental Advisory Given   Pulmonary former smoker   breath sounds clear to auscultation       Cardiovascular negative cardio ROS  Rhythm:Regular Rate:Normal     Neuro/Psych negative neurological ROS     GI/Hepatic Neg liver ROS,GERD  ,,  Endo/Other  negative endocrine ROS    Renal/GU negative Renal ROS     Musculoskeletal   Abdominal   Peds  Hematology negative hematology ROS (+)   Anesthesia Other Findings   Reproductive/Obstetrics                              Anesthesia Physical Anesthesia Plan  ASA: 2 and emergent  Anesthesia Plan: General   Post-op Pain Management: Ofirmev  IV (intra-op)* and Toradol IV (intra-op)*   Induction: Intravenous  PONV Risk Score and Plan: 3 and Dexamethasone, Ondansetron , Treatment may vary due to age or medical condition and Midazolam  Airway Management Planned: Oral ETT  Additional Equipment:   Intra-op Plan:   Post-operative Plan: Extubation in OR  Informed Consent: I have reviewed the patients History and Physical, chart, labs and discussed the procedure including the risks, benefits and alternatives for the proposed anesthesia with the patient or authorized representative who has indicated his/her understanding and acceptance.     Dental advisory given  Plan Discussed with: CRNA  Anesthesia Plan Comments:         Anesthesia Quick Evaluation

## 2024-09-03 NOTE — Op Note (Signed)
 Date of surgery : 09/03/24  Procedure open incisional hernia repair   Surgeon: Orie Silversmith, MD   Preoperative diagnosis:  incisional hernia with small bowel obstruction  Postoperative diagnosis  incisional hernia with small bowel obstruction  Operative findings: Incisional hernia measuring 2x2cm approximately.Fascial defect enlarged to facilitate examination of small bowel. One small serosal injury noted and imbricated. Obvious area of intermittently strangulated bowel with some duskiness after initial reduction but pinked up and no areas concerning for remaining ischemia or necrosis. Primary fascial repair with vicryl mesh overlay.  Anesthesia Gen. endotracheal anesthesia.   Estimated blood loss : minimal  Indication for procedure This is a pleasant 67 year old female with recent laparoscopic cholecystectomy on 10/6 who presents with SBO at incisional hernia. Partially reducible but immediate return of bowel containing hernia once reduction pressure released. Given obstructive symptoms and bowel unable to remain reduced, recommend proceeding with open incisional hernia repair.  Procedure in detail After obtaining consent from the patient patient was taken to the operating room and laid supine on the operating table.  Subsequently underwent general endotracheal anesthesia.  .  A timeout was then called.  Abdomen prepped with betadine as skin dehiscence at incision of concern when reducing hernia once asleep. Skin incision extended 5cm inferiorly. Identified incisional hernia measuring approximately 2x2cm with bruised appearing fascia. Attempted to eviscerate the small bowel but given impingement from fascia when eviscerated, bowel became purple and dusky. Decision made to extend fascial defect by 3cm in order to better evaluate small bowel. Small bowel then eviscerated and area of minimal bruising in one small segment where small bowel had been obviously intermittently strangulated. Bowel  run~30cm proximally and distally and appeared healthy. One small subcentimeter serosal injury noted on small bowel and this was imbricated with 2-0 vicryl suture. The abdomen was then irrigated. Then cleared subcutaneous tissue off of fascia for 5cm circumferentially. Closed fascia primarily with interrupted figure of 8 #1 PDS. Then placed vicryl mesh overlay and secured with 0 vicryl. Irrigated copiously. Closed subcutaneous tissue with interrupted vicryl suture and then closed skin with stapler. Local anesthetic injected into surgical site. Honeycomb dressing applied.  Patient was then reversed from general  anesthesia and taken to the recovery area.   Orie Silversmith, MD [facility]

## 2024-09-04 ENCOUNTER — Observation Stay (HOSPITAL_COMMUNITY)

## 2024-09-04 ENCOUNTER — Encounter (HOSPITAL_COMMUNITY): Payer: Self-pay | Admitting: General Surgery

## 2024-09-04 DIAGNOSIS — K56609 Unspecified intestinal obstruction, unspecified as to partial versus complete obstruction: Secondary | ICD-10-CM | POA: Diagnosis present

## 2024-09-04 DIAGNOSIS — K432 Incisional hernia without obstruction or gangrene: Secondary | ICD-10-CM | POA: Diagnosis present

## 2024-09-04 LAB — CBC
HCT: 35.8 % — ABNORMAL LOW (ref 36.0–46.0)
Hemoglobin: 12.7 g/dL (ref 12.0–15.0)
MCH: 34.8 pg — ABNORMAL HIGH (ref 26.0–34.0)
MCHC: 35.5 g/dL (ref 30.0–36.0)
MCV: 98.1 fL (ref 80.0–100.0)
Platelets: 313 K/uL (ref 150–400)
RBC: 3.65 MIL/uL — ABNORMAL LOW (ref 3.87–5.11)
RDW: 10.9 % — ABNORMAL LOW (ref 11.5–15.5)
WBC: 8.4 K/uL (ref 4.0–10.5)
nRBC: 0 % (ref 0.0–0.2)

## 2024-09-04 LAB — BASIC METABOLIC PANEL WITH GFR
Anion gap: 13 (ref 5–15)
BUN: 11 mg/dL (ref 8–23)
CO2: 24 mmol/L (ref 22–32)
Calcium: 8.2 mg/dL — ABNORMAL LOW (ref 8.9–10.3)
Chloride: 97 mmol/L — ABNORMAL LOW (ref 98–111)
Creatinine, Ser: 0.91 mg/dL (ref 0.44–1.00)
GFR, Estimated: >60 mL/min (ref >60)
Glucose, Bld: 150 mg/dL — ABNORMAL HIGH (ref 70–99)
Potassium: 4.1 mmol/L (ref 3.5–5.1)
Sodium: 134 mmol/L — ABNORMAL LOW (ref 135–145)

## 2024-09-04 MED ORDER — PANTOPRAZOLE SODIUM 40 MG IV SOLR
40.0000 mg | Freq: Every day | INTRAVENOUS | Status: DC
  Administered 2024-09-04: 40 mg via INTRAVENOUS
  Filled 2024-09-04: qty 10

## 2024-09-04 MED ORDER — HYDROMORPHONE HCL 1 MG/ML IJ SOLN
0.5000 mg | INTRAMUSCULAR | Status: DC | PRN
  Administered 2024-09-04 (×2): 0.5 mg via INTRAVENOUS
  Filled 2024-09-04 (×2): qty 0.5

## 2024-09-04 MED ORDER — ONDANSETRON 4 MG PO TBDP: 4.0000 mg | ORAL_TABLET | Freq: Four times a day (QID) | ORAL | Status: DC | PRN

## 2024-09-04 MED ORDER — LACTATED RINGERS IV SOLN: INTRAVENOUS | Status: AC

## 2024-09-04 MED ORDER — ENOXAPARIN SODIUM 40 MG/0.4ML IJ SOSY
40.0000 mg | PREFILLED_SYRINGE | INTRAMUSCULAR | Status: DC
  Administered 2024-09-04 – 2024-09-06 (×3): 40 mg via SUBCUTANEOUS
  Filled 2024-09-04 (×3): qty 0.4

## 2024-09-04 MED ORDER — OXYCODONE HCL 5 MG PO TABS
5.0000 mg | ORAL_TABLET | ORAL | Status: DC | PRN
  Administered 2024-09-04 (×2): 5 mg via ORAL
  Filled 2024-09-04 (×2): qty 1

## 2024-09-04 MED ORDER — PHENOL 1.4 % MT LIQD
1.0000 | OROMUCOSAL | Status: DC | PRN
  Administered 2024-09-04: 1 via OROMUCOSAL
  Filled 2024-09-04: qty 177

## 2024-09-04 MED ORDER — ONDANSETRON HCL 4 MG/2ML IJ SOLN: 4.0000 mg | Freq: Four times a day (QID) | INTRAMUSCULAR | Status: DC | PRN

## 2024-09-04 MED ORDER — METHOCARBAMOL 500 MG PO TABS
500.0000 mg | ORAL_TABLET | Freq: Three times a day (TID) | ORAL | Status: DC
  Administered 2024-09-04 – 2024-09-06 (×8): 500 mg via ORAL
  Filled 2024-09-04 (×8): qty 1

## 2024-09-04 MED ORDER — FENTANYL CITRATE (PF) 100 MCG/2ML IJ SOLN
INTRAMUSCULAR | Status: AC
  Filled 2024-09-04: qty 2

## 2024-09-04 MED ORDER — FENTANYL CITRATE (PF) 100 MCG/2ML IJ SOLN
25.0000 ug | INTRAMUSCULAR | Status: DC | PRN
  Administered 2024-09-04 (×3): 50 ug via INTRAVENOUS

## 2024-09-04 MED ORDER — AMISULPRIDE (ANTIEMETIC) 5 MG/2ML IV SOLN: 10.0000 mg | Freq: Once | INTRAVENOUS | Status: DC | PRN

## 2024-09-04 MED ORDER — ACETAMINOPHEN 500 MG PO TABS
1000.0000 mg | ORAL_TABLET | Freq: Four times a day (QID) | ORAL | Status: DC
  Administered 2024-09-04 – 2024-09-06 (×9): 1000 mg via ORAL
  Filled 2024-09-04 (×9): qty 2

## 2024-09-04 NOTE — Discharge Instructions (Signed)
 [facility]      [facility], [place] [phone]  OPEN ABDOMINAL SURGERY: POST OP INSTRUCTIONS  Always review your discharge instruction sheet given to you by the facility where your surgery was performed.  IF YOU HAVE DISABILITY OR FAMILY LEAVE FORMS, YOU MUST BRING THEM TO THE OFFICE FOR PROCESSING.  PLEASE DO NOT GIVE THEM TO YOUR DOCTOR.  A prescription for pain medication may be given to you upon discharge.  Take your pain medication as prescribed, if needed.  If narcotic pain medicine is not needed, then you may take acetaminophen  (Tylenol ) or ibuprofen (Advil) as needed. Take your usually prescribed medications unless otherwise directed. If you need a refill on your pain medication, please contact your pharmacy. They will contact our office to request authorization.  Prescriptions will not be filled after 5pm or on week-ends. You should follow a light diet the first few days after arrival home, such as soup and crackers, pudding, etc.unless your doctor has advised otherwise. A high-fiber, low fat diet can be resumed as tolerated.   Be sure to include lots of fluids daily. Most patients will experience some swelling and bruising on the chest and neck area.  Ice packs will help.  Swelling and bruising can take several days to resolve Most patients will experience some swelling and bruising in the area of the incision. Ice pack will help. Swelling and bruising can take several days to resolve..  It is common to experience some constipation if taking pain medication after surgery.  Increasing fluid intake and taking a stool softener will usually help or prevent this problem from occurring.  A mild laxative (Milk of Magnesia or Miralax) should be taken according to package directions if there are no bowel movements after 48 hours.  You may have steri-strips (small skin tapes) in place directly over the incision.  These strips should be left on the skin for 7-10 days.  If your surgeon used skin  glue on the incision, you may shower in 24 hours.  The glue will flake off over the next 2-3 weeks.  Any sutures or staples will be removed at the office during your follow-up visit. You may find that a light gauze bandage over your incision may keep your staples from being rubbed or pulled. You may shower and replace the bandage daily. ACTIVITIES:  You may resume regular (light) daily activities beginning the next day--such as daily self-care, walking, climbing stairs--gradually increasing activities as tolerated.  You may have sexual intercourse when it is comfortable.  Refrain from any heavy lifting or straining until approved by your doctor. You may drive when you no longer are taking prescription pain medication, you can comfortably wear a seatbelt, and you can safely maneuver your car and apply brakes Return to Work: ___________________________________ Teresa Monroe should see your doctor in the office for a follow-up appointment approximately two weeks after your surgery.  Make sure that you call for this appointment within a day or two after you arrive home to insure a convenient appointment time. OTHER INSTRUCTIONS:  _____________________________________________________________ _____________________________________________________________  WHEN TO CALL YOUR DOCTOR: Fever over 101.0 Inability to urinate Nausea and/or vomiting Extreme swelling or bruising Continued bleeding from incision. Increased pain, redness, or drainage from the incision. Difficulty swallowing or breathing Muscle cramping or spasms. Numbness or tingling in hands or feet or around lips.  The clinic staff is available to answer your questions during regular business hours.  Please don't hesitate to call and ask to speak to one of  the nurses if you have concerns.  For further questions, please visit [email or web address]

## 2024-09-04 NOTE — TOC Initial Note (Signed)
 Transition of Care [facility]) - Initial/Assessment Note    Patient Details  Name: Teresa Monroe MRN: [id number] Date of Birth: [birth year]  [facility] CM/SW Contact:    Jeoffrey LITTIE Moose, LCSWA Phone Number: 09/04/2024, 9:10 AM  Clinical Narrative:                 Pt admitted from home after having gallbladder removed 10/6- pt having nausea and vomitting since. No current TOC needs, please consult as needs arise.         Patient Goals and CMS Choice            Expected Discharge Plan and Services                                              Prior Living Arrangements/Services                       Activities of Daily Living   ADL Screening (condition at time of admission) Independently performs ADLs?: Yes (appropriate for developmental age) Is the patient deaf or have difficulty hearing?: No Does the patient have difficulty seeing, even when wearing glasses/contacts?: No Does the patient have difficulty concentrating, remembering, or making decisions?: No  Permission Sought/Granted                  Emotional Assessment              Admission diagnosis:  Dehydration [E86.0] Incisional hernia with obstruction but no gangrene [K43.0] Bowel obstruction (HCC) [K56.609] Incisional hernia [K43.2] Patient Active Problem List   Diagnosis Date Noted   Incisional hernia 09/04/2024   Bowel obstruction (HCC) 09/03/2024   RUQ pain 07/06/2024   Snoring 02/15/2024   Bicuspid aortic valve 09/18/2018   Prolonged QT interval 09/17/2018   Routine general medical examination at a health care facility 06/22/2016   B12 deficiency 09/11/2009   Anxiety and depression 09/11/2009   PCP:  Rollene Almarie LABOR, MD Pharmacy:   [facility] DELIVERY - [place], [place] - [address] [place] [address] Phone: [phone] Fax: [phone]  [facility] - [place], [place] - [address] [place] [address] Phone: [phone] Fax: [phone]  [id number] - [place] [facility] - [address] [place] [address] Phone: [phone] Fax: [phone]     Social Drivers of Health (SDOH) Social History: SDOH Screenings   Food Insecurity: No Food Insecurity (09/04/2024)  Housing: Low Risk  (09/04/2024)  Transportation Needs: No Transportation Needs (09/04/2024)  Utilities: Not At Risk (09/04/2024)  Alcohol Screen: Low Risk  (12/23/2023)  Depression (PHQ2-9): Low Risk  (07/06/2024)  Financial Resource Strain: Low Risk  (07/02/2024)  Physical Activity: Insufficiently Active (07/02/2024)  Social Connections: Socially Isolated (07/02/2024)  Stress: No Stress Concern Present (07/02/2024)  Tobacco Use: Medium Risk (09/03/2024)   SDOH Interventions:     Readmission Risk Interventions     No data to display

## 2024-09-04 NOTE — Progress Notes (Signed)
 Progress Note  1 Day Post-Op  Subjective: Daughter at bedside. Patient reports that pain is manageable. She denies BM or flatulence since surgery. Denies nausea and vomiting.   ROS  Per HPI  Objective: Vital signs in last 24 hours: Temp:  [97.9 F (36.6 C)-98.7 F (37.1 C)] 98.7 F (37.1 C) (10/14 0547) Pulse Rate:  [73-115] 73 (10/14 0547) Resp:  [9-18] 18 (10/14 0547) BP: (126-169)/(73-99) 126/76 (10/14 0547) SpO2:  [92 %-100 %] 98 % (10/14 0547) Last BM Date : 09/02/24  Intake/Output from previous day: 10/13 0701 - 10/14 0700 In: 3430.3 [I.V.:1400; NG/GT:30; IV Piggyback:2000.3] Out: 30 [Blood:30] Intake/Output this shift: No intake/output data recorded.  PE: General: Pleasant female who is laying in bed in NAD. HEENT: head is normocephalic, atraumatic. NGT in place connected to canister. Canister has <100 mL of brown fluid present. Heart: HR normal during encounter. Lungs: Respiratory effort nonlabored. Abd: Soft, ND. Appropriately tender to palpation that is more prominent of the LLQ. Midline incision with staples covered with honeycomb dressing. Incision is C/D/I. No rebound tenderness or guarding. Skin: Warm and dry. Psych: A&Ox3 with an appropriate affect.    Lab Results:  Recent Labs    09/03/24 1344 09/03/24 1743 09/04/24 0357  WBC 9.4  --  8.4  HGB 15.8* 13.9 12.7  HCT 44.2 41.0 35.8*  PLT 373  --  313   BMET Recent Labs    09/03/24 1344 09/03/24 1743 09/04/24 0357  NA 135 133* 134*  K 3.9 3.9 4.1  CL 92*  --  97*  CO2 24  --  24  GLUCOSE 95  --  150*  BUN 16  --  11  CREATININE 0.67  --  0.91  CALCIUM 10.0  --  8.2*   PT/INR No results for input(s): LABPROT, INR in the last 72 hours. CMP     Component Value Date/Time   NA 134 (L) 09/04/2024 0357   K 4.1 09/04/2024 0357   CL 97 (L) 09/04/2024 0357   CO2 24 09/04/2024 0357   GLUCOSE 150 (H) 09/04/2024 0357   BUN 11 09/04/2024 0357   CREATININE 0.91 09/04/2024 0357   CALCIUM  8.2 (L) 09/04/2024 0357   PROT 7.7 09/03/2024 1344   ALBUMIN 4.5 09/03/2024 1344   AST 19 09/03/2024 1344   ALT 13 09/03/2024 1344   ALKPHOS 67 09/03/2024 1344   BILITOT 1.6 (H) 09/03/2024 1344   GFRNONAA >60 09/04/2024 0357   GFRAA >60 09/17/2018 1251   Lipase     Component Value Date/Time   LIPASE 27 09/03/2024 1344       Studies/Results: DG Abd 1 View Result Date: 09/04/2024 CLINICAL DATA:  NG tube placement EXAM: ABDOMEN - 1 VIEW COMPARISON:  CT performed today. FINDINGS: NG tube is in the stomach.  Dilated small bowel loops.  No free air. IMPRESSION: NG tube tip in the stomach. Electronically Signed   By: Franky Crease M.D.   On: 09/04/2024 00:30   CT ABDOMEN PELVIS W CONTRAST Result Date: 09/03/2024 CLINICAL DATA:  Abdominal pain, post-op concern for incisional hernia. Recent cholecystectomy 08/27/2024 EXAM: CT ABDOMEN AND PELVIS WITH CONTRAST TECHNIQUE: Multidetector CT imaging of the abdomen and pelvis was performed using the standard protocol following bolus administration of intravenous contrast. RADIATION DOSE REDUCTION: This exam was performed according to the departmental dose-optimization program which includes automated exposure control, adjustment of the mA and/or kV according to patient size and/or use of iterative reconstruction technique. CONTRAST:  100mL OMNIPAQUE IOHEXOL  300 MG/ML  SOLN COMPARISON:  Ultrasound 07/11/2024 FINDINGS: Lower chest: Lung bases clear. No effusions. There is air noted in the anterior lower chest and into the anterior upper abdomen anterior to the left heart border. This appears to be extraperitoneal and is likely related to recent surgery. Hepatobiliary: Changes of cholecystectomy. Small fluid collection in the gallbladder fossa compatible with recent surgery. No focal hepatic abnormality or biliary ductal dilatation. Pancreas: No focal abnormality or ductal dilatation. Spleen: No focal abnormality.  Normal size. Adrenals/Urinary Tract: No  adrenal abnormality. No focal renal abnormality. No stones or hydronephrosis. Urinary bladder is unremarkable. Stomach/Bowel: Stomach is unremarkable. There are dilated small bowel loops with air-fluid levels extending into the pelvis. There is an umbilical hernia containing a small bowel loop. Distal small bowel exiting the hernia is decompressed. Findings compatible with associated small bowel obstruction. Normal appendix. Vascular/Lymphatic: No evidence of aneurysm or adenopathy. Reproductive: Calcified fibroid in the uterus.  No adnexal mass. Other: Small amount of free fluid in the pelvis and adjacent to the liver. Musculoskeletal: No acute bony abnormality. Air throughout the left anterior and lateral abdominal wall, likely from recent surgery. IMPRESSION: Changes of recent cholecystectomy. Small fluid collection in the gallbladder fossa with small amount of perihepatic and pelvic free fluid. There is also extraperitoneal gas anterior to the liver and heart in the anterior chest/abdominal wall. These findings are presumably related to recent surgery. Umbilical hernia containing a loop of small bowel. This causes small bowel obstruction. Electronically Signed   By: Franky Crease M.D.   On: 09/03/2024 18:17    Anti-infectives: Anti-infectives (From admission, onward)    Start     Dose/Rate Route Frequency Ordered Stop   09/03/24 2130  ceFAZolin (ANCEF) IVPB 2g/100 mL premix        2 g 200 mL/hr over 30 Minutes Intravenous On call to O.R. 09/03/24 2111 09/03/24 2230        Assessment/Plan POD1: S/P open incisional hernia repair by Dr. Richerd Silversmith on 09/03/2024 - Vitals stable during encounter. Afebrile. - WBC 8.4 and HGB 12.7 - No significant concerns on abdominal exam. - Continue NGT and NPO. Total output <100 mL. No output recorded.  - Will continue to monitor.   FEN: NPO; NGT VTE: Enoxaparin  ID: ancef for ppx    LOS: 1 day   I reviewed specialist notes, nursing notes, last 24 h  vitals and pain scores, last 48 h intake and output, last 24 h labs and trends, and last 24 h imaging results.   Marjorie Carlyon Favre, [facility] Surgery 09/04/2024, 9:54 AM Please see Amion for pager number during day hours 7:00am-4:30pm

## 2024-09-04 NOTE — Plan of Care (Signed)

## 2024-09-04 NOTE — Progress Notes (Signed)
 1 Day Post-Op   Subjective/Chief Complaint: Pt doing well today.  NGT in place. Some soreness    Objective: Vital signs in last 24 hours: Temp:  [98 F (36.7 C)-98.7 F (37.1 C)] 98.7 F (37.1 C) (10/14 0547) Pulse Rate:  [63-92] 63 (10/14 1000) Resp:  [9-18] 17 (10/14 1000) BP: (112-157)/(71-87) 112/71 (10/14 1000) SpO2:  [92 %-100 %] 99 % (10/14 1000) Last BM Date : 09/02/24  Intake/Output from previous day: 10/13 0701 - 10/14 0700 In: 3430.3 [I.V.:1400; NG/GT:30; IV Piggyback:2000.3] Out: 30 [Blood:30] Intake/Output this shift: No intake/output data recorded.  PE:  Constitutional: No acute distress, conversant, appears states age. Eyes: Anicteric sclerae, moist conjunctiva, no lid lag Lungs: Clear to auscultation bilaterally, normal respiratory effort CV: regular rate and rhythm, no murmurs, no peripheral edema, pedal pulses 2+ GI: Soft, no masses or hepatosplenomegaly, apprp-tender to palpation Skin: No rashes, palpation reveals normal turgor Psychiatric: appropriate judgment and insight, oriented to person, place, and time   Lab Results:  Recent Labs    09/03/24 1344 09/03/24 1743 09/04/24 0357  WBC 9.4  --  8.4  HGB 15.8* 13.9 12.7  HCT 44.2 41.0 35.8*  PLT 373  --  313   BMET Recent Labs    09/03/24 1344 09/03/24 1743 09/04/24 0357  NA 135 133* 134*  K 3.9 3.9 4.1  CL 92*  --  97*  CO2 24  --  24  GLUCOSE 95  --  150*  BUN 16  --  11  CREATININE 0.67  --  0.91  CALCIUM 10.0  --  8.2*   PT/INR No results for input(s): LABPROT, INR in the last 72 hours. ABG Recent Labs    09/03/24 1743  HCO3 27.9    Studies/Results: DG Abd 1 View Result Date: 09/04/2024 CLINICAL DATA:  NG tube placement EXAM: ABDOMEN - 1 VIEW COMPARISON:  CT performed today. FINDINGS: NG tube is in the stomach.  Dilated small bowel loops.  No free air. IMPRESSION: NG tube tip in the stomach. Electronically Signed   By: Franky Crease M.D.   On: 09/04/2024 00:30   CT  ABDOMEN PELVIS W CONTRAST Result Date: 09/03/2024 CLINICAL DATA:  Abdominal pain, post-op concern for incisional hernia. Recent cholecystectomy 08/27/2024 EXAM: CT ABDOMEN AND PELVIS WITH CONTRAST TECHNIQUE: Multidetector CT imaging of the abdomen and pelvis was performed using the standard protocol following bolus administration of intravenous contrast. RADIATION DOSE REDUCTION: This exam was performed according to the departmental dose-optimization program which includes automated exposure control, adjustment of the mA and/or kV according to patient size and/or use of iterative reconstruction technique. CONTRAST:  OMNIPAQUE IOHEXOL 300 MG/ML  SOLN COMPARISON:  Ultrasound 07/11/2024 FINDINGS: Lower chest: Lung bases clear. No effusions. There is air noted in the anterior lower chest and into the anterior upper abdomen anterior to the left heart border. This appears to be extraperitoneal and is likely related to recent surgery. Hepatobiliary: Changes of cholecystectomy. Small fluid collection in the gallbladder fossa compatible with recent surgery. No focal hepatic abnormality or biliary ductal dilatation. Pancreas: No focal abnormality or ductal dilatation. Spleen: No focal abnormality.  Normal size. Adrenals/Urinary Tract: No adrenal abnormality. No focal renal abnormality. No stones or hydronephrosis. Urinary bladder is unremarkable. Stomach/Bowel: Stomach is unremarkable. There are dilated small bowel loops with air-fluid levels extending into the pelvis. There is an umbilical hernia containing a small bowel loop. Distal small bowel exiting the hernia is decompressed. Findings compatible with associated small bowel obstruction. Normal appendix.  Vascular/Lymphatic: No evidence of aneurysm or adenopathy. Reproductive: Calcified fibroid in the uterus.  No adnexal mass. Other: Small amount of free fluid in the pelvis and adjacent to the liver. Musculoskeletal: No acute bony abnormality. Air throughout the  left anterior and lateral abdominal wall, likely from recent surgery. IMPRESSION: Changes of recent cholecystectomy. Small fluid collection in the gallbladder fossa with small amount of perihepatic and pelvic free fluid. There is also extraperitoneal gas anterior to the liver and heart in the anterior chest/abdominal wall. These findings are presumably related to recent surgery. Umbilical hernia containing a loop of small bowel. This causes small bowel obstruction. Electronically Signed   By: Franky Crease M.D.   On: 09/03/2024 18:17    Anti-infectives: Anti-infectives (From admission, onward)    Start     Dose/Rate Route Frequency Ordered Stop   09/03/24 2130  ceFAZolin (ANCEF) IVPB 2g/100 mL premix        2 g 200 mL/hr over 30 Minutes Intravenous On call to O.R. 09/03/24 2111 09/03/24 2230       Assessment/Plan: s/p Procedure(s) with comments: REPAIR, HERNIA, INCISIONAL (N/A) - umbilical POD1  Ex lap with incisional hernia repair -con't NGT -mobilize -pain control -trial PO in AM if con't to do well  LOS: 1 day    Lynda Leos 09/04/2024

## 2024-09-04 NOTE — Transfer of Care (Signed)
 Immediate Anesthesia Transfer of Care Note  Patient: Teresa Monroe  Procedure(s) Performed: REPAIR, HERNIA, INCISIONAL  Patient Location: PACU  Anesthesia Type:General  Level of Consciousness: awake, alert , and oriented  Airway & Oxygen  Therapy: Patient Spontanous Breathing and Patient connected to face mask oxygen   Post-op Assessment: Report given to RN, Post -op Vital signs reviewed and stable, and Patient moving all extremities  Post vital signs: Reviewed and stable  Last Vitals:  Vitals Value Taken Time  BP 157/82 09/04/24 00:08  Temp    Pulse 95 09/04/24 00:12  Resp 13 09/04/24 00:12  SpO2 94 % 09/04/24 00:12  Vitals shown include unfiled device data.  Last Pain:  Vitals:   09/03/24 1943  TempSrc:   PainSc: 4          Complications: No notable events documented.

## 2024-09-05 ENCOUNTER — Encounter (HOSPITAL_COMMUNITY): Payer: Self-pay | Admitting: General Surgery

## 2024-09-05 LAB — CBC
HCT: 32 % — ABNORMAL LOW (ref 36.0–46.0)
Hemoglobin: 11.3 g/dL — ABNORMAL LOW (ref 12.0–15.0)
MCH: 35.5 pg — ABNORMAL HIGH (ref 26.0–34.0)
MCHC: 35.3 g/dL (ref 30.0–36.0)
MCV: 100.6 fL — ABNORMAL HIGH (ref 80.0–100.0)
Platelets: 255 K/uL (ref 150–400)
RBC: 3.18 MIL/uL — ABNORMAL LOW (ref 3.87–5.11)
RDW: 11.1 % — ABNORMAL LOW (ref 11.5–15.5)
WBC: 6.8 K/uL (ref 4.0–10.5)
nRBC: 0 % (ref 0.0–0.2)

## 2024-09-05 LAB — BASIC METABOLIC PANEL WITH GFR
Anion gap: 12 (ref 5–15)
BUN: 8 mg/dL (ref 8–23)
CO2: 23 mmol/L (ref 22–32)
Calcium: 7.9 mg/dL — ABNORMAL LOW (ref 8.9–10.3)
Chloride: 98 mmol/L (ref 98–111)
Creatinine, Ser: 0.76 mg/dL (ref 0.44–1.00)
GFR, Estimated: >60 mL/min (ref >60)
Glucose, Bld: 74 mg/dL (ref 70–99)
Potassium: 3.5 mmol/L (ref 3.5–5.1)
Sodium: 133 mmol/L — ABNORMAL LOW (ref 135–145)

## 2024-09-05 MED ORDER — PANTOPRAZOLE SODIUM 40 MG PO TBEC
40.0000 mg | DELAYED_RELEASE_TABLET | Freq: Every day | ORAL | Status: DC
  Administered 2024-09-05: 40 mg via ORAL
  Filled 2024-09-05: qty 1

## 2024-09-05 NOTE — Plan of Care (Signed)
   Problem: Education: Goal: Knowledge of General Education information will improve Description: Including pain rating scale, medication(s)/side effects and non-pharmacologic comfort measures Outcome: Progressing   Problem: Health Behavior/Discharge Planning: Goal: Ability to manage health-related needs will improve Outcome: Progressing   Problem: Activity: Goal: Risk for activity intolerance will decrease Outcome: Progressing   Problem: Pain Managment: Goal: General experience of comfort will improve and/or be controlled Outcome: Progressing   Problem: Safety: Goal: Ability to remain free from injury will improve Outcome: Progressing

## 2024-09-05 NOTE — Progress Notes (Signed)
 2 Days Post-Op   Subjective/Chief Complaint: PT with some abd soreness No flatus Amb well   Objective: Vital signs in last 24 hours: Temp:  [97.7 F (36.5 C)-98.3 F (36.8 C)] 97.8 F (36.6 C) (10/15 0554) Pulse Rate:  [57-71] 57 (10/15 0554) Resp:  [16-18] 18 (10/15 0215) BP: (106-124)/(60-71) 124/71 (10/15 0554) SpO2:  [96 %-100 %] 98 % (10/15 0554) Last BM Date : 09/02/24  Intake/Output from previous day: 10/14 0701 - 10/15 0700 In: 2129.7 [I.V.:2129.7] Out: 400 [Emesis/NG output:400] Intake/Output this shift: No intake/output data recorded.  General appearance: alert and cooperative Incision/Wound: inc c/d/i  Lab Results:  Recent Labs    09/04/24 0357 09/05/24 0515  WBC 8.4 6.8  HGB 12.7 11.3*  HCT 35.8* 32.0*  PLT 313 255   BMET Recent Labs    09/04/24 0357 09/05/24 0515  NA 134* 133*  K 4.1 3.5  CL 97* 98  CO2 24 23  GLUCOSE 150* 74  BUN 11 8  CREATININE 0.91 0.76  CALCIUM 8.2* 7.9*   PT/INR No results for input(s): LABPROT, INR in the last 72 hours. ABG Recent Labs    09/03/24 1743  HCO3 27.9    Studies/Results: DG Abd 1 View Result Date: 09/04/2024 CLINICAL DATA:  NG tube placement EXAM: ABDOMEN - 1 VIEW COMPARISON:  CT performed today. FINDINGS: NG tube is in the stomach.  Dilated small bowel loops.  No free air. IMPRESSION: NG tube tip in the stomach. Electronically Signed   By: Franky Crease M.D.   On: 09/04/2024 00:30   CT ABDOMEN PELVIS W CONTRAST Result Date: 09/03/2024 CLINICAL DATA:  Abdominal pain, post-op concern for incisional hernia. Recent cholecystectomy 08/27/2024 EXAM: CT ABDOMEN AND PELVIS WITH CONTRAST TECHNIQUE: Multidetector CT imaging of the abdomen and pelvis was performed using the standard protocol following bolus administration of intravenous contrast. RADIATION DOSE REDUCTION: This exam was performed according to the departmental dose-optimization program which includes automated exposure control, adjustment of  the mA and/or kV according to patient size and/or use of iterative reconstruction technique. CONTRAST:  OMNIPAQUE IOHEXOL 300 MG/ML  SOLN COMPARISON:  Ultrasound 07/11/2024 FINDINGS: Lower chest: Lung bases clear. No effusions. There is air noted in the anterior lower chest and into the anterior upper abdomen anterior to the left heart border. This appears to be extraperitoneal and is likely related to recent surgery. Hepatobiliary: Changes of cholecystectomy. Small fluid collection in the gallbladder fossa compatible with recent surgery. No focal hepatic abnormality or biliary ductal dilatation. Pancreas: No focal abnormality or ductal dilatation. Spleen: No focal abnormality.  Normal size. Adrenals/Urinary Tract: No adrenal abnormality. No focal renal abnormality. No stones or hydronephrosis. Urinary bladder is unremarkable. Stomach/Bowel: Stomach is unremarkable. There are dilated small bowel loops with air-fluid levels extending into the pelvis. There is an umbilical hernia containing a small bowel loop. Distal small bowel exiting the hernia is decompressed. Findings compatible with associated small bowel obstruction. Normal appendix. Vascular/Lymphatic: No evidence of aneurysm or adenopathy. Reproductive: Calcified fibroid in the uterus.  No adnexal mass. Other: Small amount of free fluid in the pelvis and adjacent to the liver. Musculoskeletal: No acute bony abnormality. Air throughout the left anterior and lateral abdominal wall, likely from recent surgery. IMPRESSION: Changes of recent cholecystectomy. Small fluid collection in the gallbladder fossa with small amount of perihepatic and pelvic free fluid. There is also extraperitoneal gas anterior to the liver and heart in the anterior chest/abdominal wall. These findings are presumably related to recent surgery. Umbilical  hernia containing a loop of small bowel. This causes small bowel obstruction. Electronically Signed   By: Franky Crease M.D.   On:  09/03/2024 18:17    Anti-infectives: Anti-infectives (From admission, onward)    Start     Dose/Rate Route Frequency Ordered Stop   09/03/24 2130  ceFAZolin (ANCEF) IVPB 2g/100 mL premix        2 g 200 mL/hr over 30 Minutes Intravenous On call to O.R. 09/03/24 2111 09/03/24 2230       Assessment/Plan: s/p Procedure(s) with comments: REPAIR, HERNIA, INCISIONAL (N/A) - umbilical POD #2  Advance diet to fulls Clamp NGT and OK to DC if no n/v x 6hr Home tomorrow if doing well and tol reg PO  LOS: 2 days    Teresa Monroe 09/05/2024

## 2024-09-05 NOTE — Anesthesia Postprocedure Evaluation (Signed)
 Anesthesia Post Note  Patient: Teresa Monroe  Procedure(s) Performed: REPAIR, HERNIA, INCISIONAL     Patient location during evaluation: PACU Anesthesia Type: General Level of consciousness: awake and alert Pain management: pain level controlled Vital Signs Assessment: post-procedure vital signs reviewed and stable Respiratory status: spontaneous breathing, nonlabored ventilation, respiratory function stable and patient connected to nasal cannula oxygen  Cardiovascular status: blood pressure returned to baseline and stable Postop Assessment: no apparent nausea or vomiting Anesthetic complications: no   No notable events documented.  Last Vitals:  Vitals:   09/05/24 0554 09/05/24 0951  BP: 124/71 109/67  Pulse: (!) 57 72  Resp:  16  Temp: 36.6 C 36.8 C  SpO2: 98% 97%    Last Pain:  Vitals:   09/05/24 0952  TempSrc:   PainSc: 2                  Epifanio Lamar BRAVO

## 2024-09-05 NOTE — Progress Notes (Signed)
 Patient reports no complaints on n/v. Removed NGT without pain or discomfort voiced from patient.

## 2024-09-06 NOTE — Progress Notes (Signed)
 Patient discharged to home by Silvio Blow, RN. See nurse's note for details.

## 2024-09-06 NOTE — Progress Notes (Signed)
 Discharge Nurse Summary: DC order noted per MD. DC RN at bedside with patient. Patient agreeable with discharge plan, states family will arrive soon for pickup. AVS printed/reviewed. PIV removed, skin intact. No DME needs. No home/TOC meds. CP/Edu resolved. Telemonitor not present on assessment. All belongings accounted for. Dressing to midline abdominal incision, CDI w/o bleeding or drainage. See LDAs. Patient wheeled downstairs for discharge by private auto by volunteer transport.   Rosario EMERSON Lund, RN

## 2024-09-06 NOTE — Discharge Summary (Signed)
 Physician Discharge Summary  Patient ID: Teresa Monroe MRN: [id number] DOB/AGE: 12/18/1955 68 y.o.  Admit date: 09/03/2024 Discharge date: 09/06/2024  Admission Diagnoses:SBO and incisional richter's hernia  Discharge Diagnoses:  Principal Problem:   Bowel obstruction (HCC) Active Problems:   Incisional hernia   SBO (small bowel obstruction) (HCC)   Discharged Condition: good  Hospital Course: PT admitted and underwent surgery as per OR note. Pt did well post op and was weaned from NGT.  This was removed and she was started on a FLD.  She tol that well and had good bowel fxn.  She was adv to a soft diet and tol that well.   She was o/w amb well on her own and had good pain control.  She was deemed stable for  DC and DC'd home.  Consults: None  Significant Diagnostic Studies: CT with SBO and inc hernia  Treatments: surgery: as above  Discharge Exam: Blood pressure 127/72, pulse (!) 59, temperature 97.9 F (36.6 C), temperature source Oral, resp. rate 17, height 5' 7 (1.702 m), weight 59 kg, SpO2 95%. General appearance: alert and cooperative GI: soft, non-tender; bowel sounds normal; no masses,  no organomegaly and inc c/d/i  Disposition: Discharge disposition: 01-Home or Self Care       Discharge Instructions     Diet - low sodium heart healthy   Complete by: As directed    Increase activity slowly   Complete by: As directed       Allergies as of 09/06/2024       Reactions   Ultram [tramadol] Other (See Comments)   Stomach pain         Medication List     TAKE these medications    buPROPion  150 MG 24 hr tablet Commonly known as: WELLBUTRIN  XL Take 1 tablet (150 mg total) by mouth daily.   cyanocobalamin  1000 MCG/ML injection Commonly known as: VITAMIN B12 INJECT 1 ML (1,000 MCG TOTAL) INTO THE MUSCLE EVERY 30 (THIRTY) DAYS.   ibuprofen 200 MG tablet Commonly known as: ADVIL Take 400 mg by mouth 2 (two) times daily as needed for headache  (pain).   ondansetron  4 MG disintegrating tablet Commonly known as: ZOFRAN -ODT Take 4 mg by mouth every 8 (eight) hours as needed for nausea or vomiting.   pravastatin  20 MG tablet Commonly known as: PRAVACHOL  Take 1 tablet (20 mg total) by mouth daily.        Follow-up Information     Ann Fine, MD. Go on 09/26/2024.   Specialty: General Surgery Why: At 9:45AM, Arrive 30 mins prior to scheduled appointment time, Please bring insurance cards and ID Contact information: [address] [place] [address] [phone]                 Signed: Lynda Leos 09/06/2024, 8:41 AM

## 2024-09-06 NOTE — Plan of Care (Signed)

## 2024-09-17 ENCOUNTER — Encounter: Payer: Self-pay | Admitting: Internal Medicine

## 2024-12-05 ENCOUNTER — Encounter: Payer: Self-pay | Admitting: Internal Medicine

## 2024-12-07 MED ORDER — BUPROPION HCL ER (XL) 150 MG PO TB24: 150.0000 mg | ORAL_TABLET | Freq: Every day | ORAL | 3 refills | Status: DC

## 2024-12-19 ENCOUNTER — Other Ambulatory Visit: Payer: Self-pay | Admitting: Internal Medicine

## 2024-12-27 ENCOUNTER — Telehealth: Payer: Self-pay

## 2024-12-27 ENCOUNTER — Other Ambulatory Visit: Payer: Self-pay

## 2024-12-27 MED ORDER — BUPROPION HCL ER (XL) 150 MG PO TB24: 150.0000 mg | ORAL_TABLET | Freq: Every day | ORAL | 3 refills | Status: AC

## 2024-12-27 NOTE — Telephone Encounter (Signed)
 Copied from CRM [id number]. Topic: Clinical - Prescription Issue >> Dec 27, 2024 10:01 AM Maisie BROCKS wrote: Reason for CRM: pt's medication,Bupropion , needs to be sent to [facility] [address]. Pt sent a MyChart msg on 1/28. Please advise.

## 2024-12-27 NOTE — Telephone Encounter (Signed)
 Called and spoke with pharmacist @ [facility] to cxl refill request. Was made aware that this request had already been cancelled. Sent in rx refill to [facility] on [address]. Pt is aware.

## 2025-01-29 ENCOUNTER — Encounter: Admitting: Internal Medicine
